# Patient Record
Sex: Male | Born: 1935
Health system: Southern US, Community
[De-identification: ages and names within clinical notes are randomized; demographics above are authoritative.]

## PROBLEM LIST (undated history)

## (undated) DIAGNOSIS — I739 Peripheral vascular disease, unspecified: Secondary | ICD-10-CM

## (undated) DIAGNOSIS — I779 Disorder of arteries and arterioles, unspecified: Secondary | ICD-10-CM

## (undated) DIAGNOSIS — M109 Gout, unspecified: Secondary | ICD-10-CM

## (undated) DIAGNOSIS — N400 Enlarged prostate without lower urinary tract symptoms: Secondary | ICD-10-CM

## (undated) DIAGNOSIS — I2510 Atherosclerotic heart disease of native coronary artery without angina pectoris: Secondary | ICD-10-CM

## (undated) DIAGNOSIS — I10 Essential (primary) hypertension: Secondary | ICD-10-CM

## (undated) HISTORY — PX: CORONARY ARTERY BYPASS GRAFT: SHX141

## (undated) HISTORY — PX: APPENDECTOMY: SHX54

## (undated) HISTORY — PX: TONSILLECTOMY: SUR1361

## (undated) HISTORY — PX: HERNIA REPAIR: SHX51

---

## 2009-02-14 DIAGNOSIS — I219 Acute myocardial infarction, unspecified: Secondary | ICD-10-CM

## 2009-02-14 HISTORY — DX: Acute myocardial infarction, unspecified: I21.9

## 2015-03-11 DIAGNOSIS — I2510 Atherosclerotic heart disease of native coronary artery without angina pectoris: Secondary | ICD-10-CM | POA: Diagnosis not present

## 2015-03-11 DIAGNOSIS — I872 Venous insufficiency (chronic) (peripheral): Secondary | ICD-10-CM | POA: Diagnosis not present

## 2015-03-11 DIAGNOSIS — I252 Old myocardial infarction: Secondary | ICD-10-CM | POA: Diagnosis not present

## 2015-03-11 DIAGNOSIS — Z6830 Body mass index (BMI) 30.0-30.9, adult: Secondary | ICD-10-CM | POA: Diagnosis not present

## 2015-07-09 DIAGNOSIS — I872 Venous insufficiency (chronic) (peripheral): Secondary | ICD-10-CM | POA: Diagnosis not present

## 2015-07-09 DIAGNOSIS — E785 Hyperlipidemia, unspecified: Secondary | ICD-10-CM | POA: Diagnosis not present

## 2015-07-09 DIAGNOSIS — I2510 Atherosclerotic heart disease of native coronary artery without angina pectoris: Secondary | ICD-10-CM | POA: Diagnosis not present

## 2015-07-09 DIAGNOSIS — I252 Old myocardial infarction: Secondary | ICD-10-CM | POA: Diagnosis not present

## 2015-11-13 DIAGNOSIS — I209 Angina pectoris, unspecified: Secondary | ICD-10-CM | POA: Diagnosis not present

## 2015-11-13 DIAGNOSIS — I25118 Atherosclerotic heart disease of native coronary artery with other forms of angina pectoris: Secondary | ICD-10-CM | POA: Diagnosis not present

## 2015-11-13 DIAGNOSIS — R0602 Shortness of breath: Secondary | ICD-10-CM | POA: Diagnosis not present

## 2015-11-13 DIAGNOSIS — I872 Venous insufficiency (chronic) (peripheral): Secondary | ICD-10-CM | POA: Diagnosis not present

## 2015-12-04 DIAGNOSIS — I209 Angina pectoris, unspecified: Secondary | ICD-10-CM | POA: Diagnosis not present

## 2015-12-04 DIAGNOSIS — R0602 Shortness of breath: Secondary | ICD-10-CM | POA: Diagnosis not present

## 2015-12-14 DIAGNOSIS — I2510 Atherosclerotic heart disease of native coronary artery without angina pectoris: Secondary | ICD-10-CM | POA: Diagnosis not present

## 2015-12-21 DIAGNOSIS — I739 Peripheral vascular disease, unspecified: Secondary | ICD-10-CM | POA: Diagnosis not present

## 2015-12-26 DIAGNOSIS — I10 Essential (primary) hypertension: Secondary | ICD-10-CM | POA: Diagnosis not present

## 2015-12-26 DIAGNOSIS — J40 Bronchitis, not specified as acute or chronic: Secondary | ICD-10-CM | POA: Diagnosis not present

## 2015-12-26 DIAGNOSIS — F17210 Nicotine dependence, cigarettes, uncomplicated: Secondary | ICD-10-CM | POA: Diagnosis not present

## 2015-12-26 DIAGNOSIS — I2510 Atherosclerotic heart disease of native coronary artery without angina pectoris: Secondary | ICD-10-CM | POA: Diagnosis not present

## 2015-12-26 DIAGNOSIS — J410 Simple chronic bronchitis: Secondary | ICD-10-CM | POA: Diagnosis not present

## 2015-12-26 DIAGNOSIS — I4891 Unspecified atrial fibrillation: Secondary | ICD-10-CM | POA: Diagnosis not present

## 2015-12-26 DIAGNOSIS — R0602 Shortness of breath: Secondary | ICD-10-CM | POA: Diagnosis not present

## 2015-12-26 DIAGNOSIS — R9431 Abnormal electrocardiogram [ECG] [EKG]: Secondary | ICD-10-CM | POA: Diagnosis not present

## 2016-01-30 DIAGNOSIS — I440 Atrioventricular block, first degree: Secondary | ICD-10-CM | POA: Diagnosis not present

## 2016-01-30 DIAGNOSIS — Z7982 Long term (current) use of aspirin: Secondary | ICD-10-CM | POA: Diagnosis not present

## 2016-01-30 DIAGNOSIS — R0789 Other chest pain: Secondary | ICD-10-CM | POA: Diagnosis not present

## 2016-01-30 DIAGNOSIS — E7800 Pure hypercholesterolemia, unspecified: Secondary | ICD-10-CM | POA: Diagnosis not present

## 2016-01-30 DIAGNOSIS — Z951 Presence of aortocoronary bypass graft: Secondary | ICD-10-CM | POA: Diagnosis not present

## 2016-01-30 DIAGNOSIS — Z955 Presence of coronary angioplasty implant and graft: Secondary | ICD-10-CM | POA: Diagnosis not present

## 2016-01-30 DIAGNOSIS — I2510 Atherosclerotic heart disease of native coronary artery without angina pectoris: Secondary | ICD-10-CM | POA: Diagnosis not present

## 2016-01-30 DIAGNOSIS — M79601 Pain in right arm: Secondary | ICD-10-CM | POA: Diagnosis not present

## 2016-01-30 DIAGNOSIS — R079 Chest pain, unspecified: Secondary | ICD-10-CM | POA: Diagnosis not present

## 2016-01-30 DIAGNOSIS — M19039 Primary osteoarthritis, unspecified wrist: Secondary | ICD-10-CM | POA: Diagnosis not present

## 2016-01-30 DIAGNOSIS — M25531 Pain in right wrist: Secondary | ICD-10-CM | POA: Diagnosis not present

## 2016-01-30 DIAGNOSIS — I739 Peripheral vascular disease, unspecified: Secondary | ICD-10-CM | POA: Diagnosis not present

## 2016-01-30 DIAGNOSIS — I10 Essential (primary) hypertension: Secondary | ICD-10-CM | POA: Diagnosis not present

## 2016-01-30 DIAGNOSIS — F17210 Nicotine dependence, cigarettes, uncomplicated: Secondary | ICD-10-CM | POA: Diagnosis not present

## 2016-01-30 DIAGNOSIS — I4510 Unspecified right bundle-branch block: Secondary | ICD-10-CM | POA: Diagnosis not present

## 2016-01-30 DIAGNOSIS — R200 Anesthesia of skin: Secondary | ICD-10-CM | POA: Diagnosis not present

## 2016-01-30 DIAGNOSIS — Z79899 Other long term (current) drug therapy: Secondary | ICD-10-CM | POA: Diagnosis not present

## 2016-01-30 DIAGNOSIS — R202 Paresthesia of skin: Secondary | ICD-10-CM | POA: Diagnosis not present

## 2016-01-30 DIAGNOSIS — M79631 Pain in right forearm: Secondary | ICD-10-CM | POA: Diagnosis not present

## 2016-02-03 DIAGNOSIS — I10 Essential (primary) hypertension: Secondary | ICD-10-CM | POA: Diagnosis not present

## 2016-02-03 DIAGNOSIS — I739 Peripheral vascular disease, unspecified: Secondary | ICD-10-CM | POA: Diagnosis not present

## 2016-02-03 DIAGNOSIS — I2510 Atherosclerotic heart disease of native coronary artery without angina pectoris: Secondary | ICD-10-CM | POA: Diagnosis not present

## 2016-02-03 DIAGNOSIS — R0989 Other specified symptoms and signs involving the circulatory and respiratory systems: Secondary | ICD-10-CM | POA: Diagnosis not present

## 2016-02-18 DIAGNOSIS — H9190 Unspecified hearing loss, unspecified ear: Secondary | ICD-10-CM | POA: Diagnosis not present

## 2016-02-18 DIAGNOSIS — Z951 Presence of aortocoronary bypass graft: Secondary | ICD-10-CM | POA: Diagnosis not present

## 2016-02-18 DIAGNOSIS — Z8673 Personal history of transient ischemic attack (TIA), and cerebral infarction without residual deficits: Secondary | ICD-10-CM | POA: Diagnosis not present

## 2016-02-18 DIAGNOSIS — E785 Hyperlipidemia, unspecified: Secondary | ICD-10-CM | POA: Diagnosis not present

## 2016-02-18 DIAGNOSIS — J09X2 Influenza due to identified novel influenza A virus with other respiratory manifestations: Secondary | ICD-10-CM | POA: Diagnosis not present

## 2016-02-18 DIAGNOSIS — J101 Influenza due to other identified influenza virus with other respiratory manifestations: Secondary | ICD-10-CM | POA: Diagnosis not present

## 2016-02-18 DIAGNOSIS — E7800 Pure hypercholesterolemia, unspecified: Secondary | ICD-10-CM | POA: Diagnosis not present

## 2016-02-18 DIAGNOSIS — I2510 Atherosclerotic heart disease of native coronary artery without angina pectoris: Secondary | ICD-10-CM | POA: Diagnosis not present

## 2016-02-18 DIAGNOSIS — M5431 Sciatica, right side: Secondary | ICD-10-CM | POA: Diagnosis not present

## 2016-02-18 DIAGNOSIS — I10 Essential (primary) hypertension: Secondary | ICD-10-CM | POA: Diagnosis not present

## 2016-04-01 DIAGNOSIS — Z720 Tobacco use: Secondary | ICD-10-CM | POA: Diagnosis not present

## 2016-04-01 DIAGNOSIS — J111 Influenza due to unidentified influenza virus with other respiratory manifestations: Secondary | ICD-10-CM | POA: Diagnosis not present

## 2016-04-01 DIAGNOSIS — R070 Pain in throat: Secondary | ICD-10-CM | POA: Diagnosis not present

## 2016-04-01 DIAGNOSIS — Z8719 Personal history of other diseases of the digestive system: Secondary | ICD-10-CM | POA: Diagnosis not present

## 2016-04-01 DIAGNOSIS — R05 Cough: Secondary | ICD-10-CM | POA: Diagnosis not present

## 2016-04-01 DIAGNOSIS — E7800 Pure hypercholesterolemia, unspecified: Secondary | ICD-10-CM | POA: Diagnosis not present

## 2016-04-01 DIAGNOSIS — Z7901 Long term (current) use of anticoagulants: Secondary | ICD-10-CM | POA: Diagnosis not present

## 2016-04-01 DIAGNOSIS — I10 Essential (primary) hypertension: Secondary | ICD-10-CM | POA: Diagnosis not present

## 2016-04-01 DIAGNOSIS — Z8673 Personal history of transient ischemic attack (TIA), and cerebral infarction without residual deficits: Secondary | ICD-10-CM | POA: Diagnosis not present

## 2016-04-01 DIAGNOSIS — Z951 Presence of aortocoronary bypass graft: Secondary | ICD-10-CM | POA: Diagnosis not present

## 2016-04-01 DIAGNOSIS — I2510 Atherosclerotic heart disease of native coronary artery without angina pectoris: Secondary | ICD-10-CM | POA: Diagnosis not present

## 2016-04-01 DIAGNOSIS — M1990 Unspecified osteoarthritis, unspecified site: Secondary | ICD-10-CM | POA: Diagnosis not present

## 2016-04-01 DIAGNOSIS — J9801 Acute bronchospasm: Secondary | ICD-10-CM | POA: Diagnosis not present

## 2016-04-01 DIAGNOSIS — Z8739 Personal history of other diseases of the musculoskeletal system and connective tissue: Secondary | ICD-10-CM | POA: Diagnosis not present

## 2016-06-19 DIAGNOSIS — M19019 Primary osteoarthritis, unspecified shoulder: Secondary | ICD-10-CM | POA: Diagnosis not present

## 2016-06-19 DIAGNOSIS — M25512 Pain in left shoulder: Secondary | ICD-10-CM | POA: Diagnosis not present

## 2016-06-19 DIAGNOSIS — M19012 Primary osteoarthritis, left shoulder: Secondary | ICD-10-CM | POA: Diagnosis not present

## 2016-06-19 DIAGNOSIS — G8929 Other chronic pain: Secondary | ICD-10-CM | POA: Diagnosis not present

## 2016-07-08 DIAGNOSIS — Z716 Tobacco abuse counseling: Secondary | ICD-10-CM | POA: Diagnosis not present

## 2016-07-08 DIAGNOSIS — I10 Essential (primary) hypertension: Secondary | ICD-10-CM | POA: Diagnosis not present

## 2016-07-08 DIAGNOSIS — Z720 Tobacco use: Secondary | ICD-10-CM | POA: Diagnosis not present

## 2016-07-08 DIAGNOSIS — E782 Mixed hyperlipidemia: Secondary | ICD-10-CM | POA: Diagnosis not present

## 2016-07-08 DIAGNOSIS — I2510 Atherosclerotic heart disease of native coronary artery without angina pectoris: Secondary | ICD-10-CM | POA: Diagnosis not present

## 2016-08-23 DIAGNOSIS — I10 Essential (primary) hypertension: Secondary | ICD-10-CM | POA: Diagnosis not present

## 2016-08-23 DIAGNOSIS — M19019 Primary osteoarthritis, unspecified shoulder: Secondary | ICD-10-CM | POA: Diagnosis not present

## 2016-08-23 DIAGNOSIS — N183 Chronic kidney disease, stage 3 (moderate): Secondary | ICD-10-CM | POA: Diagnosis not present

## 2016-08-23 DIAGNOSIS — E785 Hyperlipidemia, unspecified: Secondary | ICD-10-CM | POA: Diagnosis not present

## 2016-08-23 DIAGNOSIS — Z6831 Body mass index (BMI) 31.0-31.9, adult: Secondary | ICD-10-CM | POA: Diagnosis not present

## 2016-08-25 DIAGNOSIS — E785 Hyperlipidemia, unspecified: Secondary | ICD-10-CM | POA: Diagnosis not present

## 2016-08-25 DIAGNOSIS — I2510 Atherosclerotic heart disease of native coronary artery without angina pectoris: Secondary | ICD-10-CM | POA: Diagnosis not present

## 2016-08-25 DIAGNOSIS — I129 Hypertensive chronic kidney disease with stage 1 through stage 4 chronic kidney disease, or unspecified chronic kidney disease: Secondary | ICD-10-CM | POA: Diagnosis not present

## 2016-08-25 DIAGNOSIS — N201 Calculus of ureter: Secondary | ICD-10-CM | POA: Diagnosis not present

## 2016-08-25 DIAGNOSIS — N183 Chronic kidney disease, stage 3 (moderate): Secondary | ICD-10-CM | POA: Diagnosis not present

## 2016-08-25 DIAGNOSIS — M19019 Primary osteoarthritis, unspecified shoulder: Secondary | ICD-10-CM | POA: Diagnosis not present

## 2016-08-25 DIAGNOSIS — M19012 Primary osteoarthritis, left shoulder: Secondary | ICD-10-CM | POA: Diagnosis not present

## 2016-08-29 DIAGNOSIS — M19072 Primary osteoarthritis, left ankle and foot: Secondary | ICD-10-CM | POA: Diagnosis not present

## 2016-08-29 DIAGNOSIS — G8929 Other chronic pain: Secondary | ICD-10-CM | POA: Diagnosis not present

## 2016-08-29 DIAGNOSIS — M79672 Pain in left foot: Secondary | ICD-10-CM | POA: Diagnosis not present

## 2016-08-29 DIAGNOSIS — M25512 Pain in left shoulder: Secondary | ICD-10-CM | POA: Diagnosis not present

## 2016-08-29 DIAGNOSIS — I10 Essential (primary) hypertension: Secondary | ICD-10-CM | POA: Diagnosis not present

## 2016-08-29 DIAGNOSIS — F17210 Nicotine dependence, cigarettes, uncomplicated: Secondary | ICD-10-CM | POA: Diagnosis not present

## 2016-09-08 DIAGNOSIS — S43012A Anterior subluxation of left humerus, initial encounter: Secondary | ICD-10-CM | POA: Diagnosis not present

## 2016-09-08 DIAGNOSIS — Z6830 Body mass index (BMI) 30.0-30.9, adult: Secondary | ICD-10-CM | POA: Diagnosis not present

## 2016-09-08 DIAGNOSIS — Z13820 Encounter for screening for osteoporosis: Secondary | ICD-10-CM | POA: Diagnosis not present

## 2016-09-08 DIAGNOSIS — F17290 Nicotine dependence, other tobacco product, uncomplicated: Secondary | ICD-10-CM | POA: Diagnosis not present

## 2016-09-19 DIAGNOSIS — S46012A Strain of muscle(s) and tendon(s) of the rotator cuff of left shoulder, initial encounter: Secondary | ICD-10-CM | POA: Diagnosis not present

## 2016-09-21 DIAGNOSIS — E559 Vitamin D deficiency, unspecified: Secondary | ICD-10-CM | POA: Diagnosis not present

## 2016-09-21 DIAGNOSIS — M810 Age-related osteoporosis without current pathological fracture: Secondary | ICD-10-CM | POA: Diagnosis not present

## 2016-09-21 DIAGNOSIS — S46012A Strain of muscle(s) and tendon(s) of the rotator cuff of left shoulder, initial encounter: Secondary | ICD-10-CM | POA: Diagnosis not present

## 2016-09-21 DIAGNOSIS — M859 Disorder of bone density and structure, unspecified: Secondary | ICD-10-CM | POA: Diagnosis not present

## 2016-10-25 DIAGNOSIS — M25572 Pain in left ankle and joints of left foot: Secondary | ICD-10-CM | POA: Diagnosis not present

## 2016-10-25 DIAGNOSIS — M1990 Unspecified osteoarthritis, unspecified site: Secondary | ICD-10-CM | POA: Diagnosis not present

## 2016-10-25 DIAGNOSIS — M109 Gout, unspecified: Secondary | ICD-10-CM | POA: Diagnosis not present

## 2016-10-25 DIAGNOSIS — F17200 Nicotine dependence, unspecified, uncomplicated: Secondary | ICD-10-CM | POA: Diagnosis not present

## 2016-10-28 ENCOUNTER — Encounter: Payer: Self-pay | Admitting: Podiatry

## 2016-10-28 ENCOUNTER — Ambulatory Visit (INDEPENDENT_AMBULATORY_CARE_PROVIDER_SITE_OTHER): Payer: Medicare Other | Admitting: Podiatry

## 2016-10-28 ENCOUNTER — Ambulatory Visit (INDEPENDENT_AMBULATORY_CARE_PROVIDER_SITE_OTHER): Payer: Medicare Other

## 2016-10-28 ENCOUNTER — Other Ambulatory Visit: Payer: Self-pay | Admitting: Podiatry

## 2016-10-28 VITALS — BP 174/92 | HR 78 | Resp 16

## 2016-10-28 DIAGNOSIS — M25572 Pain in left ankle and joints of left foot: Secondary | ICD-10-CM

## 2016-10-28 DIAGNOSIS — M779 Enthesopathy, unspecified: Secondary | ICD-10-CM | POA: Diagnosis not present

## 2016-10-28 DIAGNOSIS — M1000 Idiopathic gout, unspecified site: Secondary | ICD-10-CM

## 2016-10-28 MED ORDER — METHYLPREDNISOLONE 4 MG PO TBPK: ORAL_TABLET | ORAL | 0 refills | Status: DC

## 2016-10-28 MED ORDER — TRIAMCINOLONE ACETONIDE 10 MG/ML IJ SUSP
10.0000 mg | Freq: Once | INTRAMUSCULAR | Status: AC
  Administered 2016-10-28: 10 mg

## 2016-10-28 NOTE — Patient Instructions (Signed)
Calcium Pyrophosphate Deposition  Calcium pyrophosphate deposition (CPPD), which is also called pseudogout, is a type of arthritis that causes pain, swelling, and inflammation in a joint. The joint pain can be severe and may last for days. If it is not treated, the pain may last much longer. Attacks of CPPD may come and go. This condition usually affects one joint at a time. The joints that are affected most commonly are the knees, but this condition can also affect the wrists, elbows, shoulders, or ankles.  CPPD is similar to gout. Both conditions result from the buildup of crystals in the joint. However, CPPD is caused by a type of crystal that is different than the crystals that cause gout.  What are the causes?  This condition is caused by the buildup of calcium pyrophosphate dihydrate crystals in the joint. The reason why this buildup occurs is not known. The condition may be passed down from parent to child (hereditary).  What increases the risk?  This condition is more likely to develop in people who:  · Are over 60 years old.  · Have a family history of the condition.  · Have had joint replacement surgery.  · Have had a recent injury.  · Have certain medical conditions, such as hemophilia, ochronosis, amyloidosis, or hormonal disorders.  · Have low blood magnesium levels.    What are the signs or symptoms?  Symptoms of this condition include:  · Pain in a joint. The pain may:  ? Be intense and constant.  ? Come on quickly.  ? Get worse with movement.  ? Last from several days to a few weeks.  · Redness, swelling, and warmth at the joint.  · Stiffness of the joint.    How is this diagnosed?  To diagnose this condition, your health care provider will use a needle to remove fluid from the joint. The fluid will be examined under a microscope to check for the crystals that cause CPPD. You may also have imaging tests, such as:  · X-rays.  · Ultrasound.    How is this treated?  There is no way to remove the  crystals from the joint and no way to cure this condition. However, treatment can relieve symptoms and improve joint function. Treatment may include:  · Nonsteroidal anti-inflammatory drugs (NSAIDs) to reduce inflammation and pain.  · Medicines to help prevent attacks.  · Injections of medicine (cortisone) into the joint to reduce pain and swelling.  · Physical therapy to improve joint function.    Follow these instructions at home:  · Take medicines only as directed by your health care provider.  · Rest the affected joints until your symptoms start to go away.  · Keep your affected joints raised (elevated) when possible. This will help to reduce swelling.  · If directed, apply ice to the affected area:  ? Put ice in a plastic bag.  ? Place a towel between your skin and the bag.  ? Leave the ice on for 20 minutes, 2-3 times per day.  · If the painful joint is in your leg, use crutches as directed by your health care provider.  · When your symptoms start to go away, begin to exercise regularly or do physical therapy. Talk with your health care provider or physical therapist about what types of exercise are safe for you. Low-impact exercise may be best. This includes walking, swimming, bicycling, and water aerobics.  · Maintain a healthy weight so your joints do not   need to bear more weight than necessary.  Contact a health care provider if:  · You have an increase in joint pain that is not relieved with medicine.  · Your joint becomes more red, swollen, or stiff.  · You have a fever.  · You have a skin rash.  This information is not intended to replace advice given to you by your health care provider. Make sure you discuss any questions you have with your health care provider.  Document Released: 10/24/2003 Document Revised: 07/09/2015 Document Reviewed: 01/08/2014  Elsevier Interactive Patient Education © 2018 [employer]

## 2016-10-28 NOTE — Progress Notes (Signed)
   Subjective:    Patient ID: Terry Fitzgerald, male    DOB: February 04, 1936, 81 y.o.   MRN: [id number]  HPI Chief Complaint  Patient presents with  . Ankle Pain    Left foot; medial side; redness and warm to the touch; pt stated, "Has arthritis in ankle; went to ER in [place] on Monday, October 25, 2016 and was given Motrin; was told had Gout and acute arthritis"; x2 days      Review of Systems  Musculoskeletal: Positive for gait problem.  All other systems reviewed and are negative.      Objective:   Physical Exam        Assessment & Plan:

## 2016-10-29 NOTE — Progress Notes (Signed)
Subjective:    Patient ID: Terry Fitzgerald, male   DOB: 81 y.o.   MRN: [id number]   HPI patient states that he has a lot of pain in the medial side of his left ankle and he's had a history of gout but is not had an attack for a fairly long period of time    Review of Systems  All other systems reviewed and are negative.       Objective:  Physical Exam  Constitutional: He appears well-developed and well-nourished.  Cardiovascular: Intact distal pulses.   Pulmonary/Chest: Effort normal.  Musculoskeletal: Normal range of motion.  Neurological: He is alert. Abnormal coordination: neurovascular status intact muscle strength adequate range of motion within normal limits with patient noted to have.  Skin: Skin is warm.  Nursing note and vitals reviewed.  quite a bit of discomfort in the medial ankle left with inflammation redness and no indication of posterior tibial dysfunction. Found to have good digital perfusion and well oriented 3     Assessment:  Inflammatory tendinitis of the medial side of the left ankle with probability of gout and no indication of infection       Plan:    Careful injection administered left 3 Milligan Kenalog 5 mill grams Xylocaine after H&P and review of x-rays. I then placed patient on Medrol Dosepak 6 day and advised on soaks therapy  X-rays indicate that there is no indications of arthritic process or collapse medial longitudinal arch

## 2016-11-17 DIAGNOSIS — J441 Chronic obstructive pulmonary disease with (acute) exacerbation: Secondary | ICD-10-CM | POA: Diagnosis not present

## 2016-11-17 DIAGNOSIS — I2510 Atherosclerotic heart disease of native coronary artery without angina pectoris: Secondary | ICD-10-CM | POA: Diagnosis not present

## 2016-11-17 DIAGNOSIS — I10 Essential (primary) hypertension: Secondary | ICD-10-CM | POA: Diagnosis not present

## 2016-11-17 DIAGNOSIS — E7849 Other hyperlipidemia: Secondary | ICD-10-CM | POA: Diagnosis not present

## 2016-11-23 DIAGNOSIS — I2510 Atherosclerotic heart disease of native coronary artery without angina pectoris: Secondary | ICD-10-CM | POA: Diagnosis not present

## 2016-11-23 DIAGNOSIS — J441 Chronic obstructive pulmonary disease with (acute) exacerbation: Secondary | ICD-10-CM | POA: Diagnosis not present

## 2016-11-23 DIAGNOSIS — I10 Essential (primary) hypertension: Secondary | ICD-10-CM | POA: Diagnosis not present

## 2016-11-23 DIAGNOSIS — E7849 Other hyperlipidemia: Secondary | ICD-10-CM | POA: Diagnosis not present

## 2016-11-23 DIAGNOSIS — Z131 Encounter for screening for diabetes mellitus: Secondary | ICD-10-CM | POA: Diagnosis not present

## 2016-11-28 DIAGNOSIS — M19012 Primary osteoarthritis, left shoulder: Secondary | ICD-10-CM | POA: Diagnosis not present

## 2016-11-28 DIAGNOSIS — M75122 Complete rotator cuff tear or rupture of left shoulder, not specified as traumatic: Secondary | ICD-10-CM | POA: Diagnosis not present

## 2017-03-13 DIAGNOSIS — M75122 Complete rotator cuff tear or rupture of left shoulder, not specified as traumatic: Secondary | ICD-10-CM | POA: Diagnosis not present

## 2017-03-13 DIAGNOSIS — M19012 Primary osteoarthritis, left shoulder: Secondary | ICD-10-CM | POA: Diagnosis not present

## 2017-03-27 DIAGNOSIS — Z0000 Encounter for general adult medical examination without abnormal findings: Secondary | ICD-10-CM | POA: Diagnosis not present

## 2017-03-27 DIAGNOSIS — I2510 Atherosclerotic heart disease of native coronary artery without angina pectoris: Secondary | ICD-10-CM | POA: Diagnosis not present

## 2017-03-27 DIAGNOSIS — E7849 Other hyperlipidemia: Secondary | ICD-10-CM | POA: Diagnosis not present

## 2017-03-27 DIAGNOSIS — J441 Chronic obstructive pulmonary disease with (acute) exacerbation: Secondary | ICD-10-CM | POA: Diagnosis not present

## 2017-03-27 DIAGNOSIS — I10 Essential (primary) hypertension: Secondary | ICD-10-CM | POA: Diagnosis not present

## 2017-04-10 DIAGNOSIS — K4090 Unilateral inguinal hernia, without obstruction or gangrene, not specified as recurrent: Secondary | ICD-10-CM | POA: Diagnosis not present

## 2017-04-19 DIAGNOSIS — K4090 Unilateral inguinal hernia, without obstruction or gangrene, not specified as recurrent: Secondary | ICD-10-CM | POA: Diagnosis not present

## 2017-05-08 DIAGNOSIS — I119 Hypertensive heart disease without heart failure: Secondary | ICD-10-CM | POA: Diagnosis not present

## 2017-05-08 DIAGNOSIS — Z87891 Personal history of nicotine dependence: Secondary | ICD-10-CM | POA: Diagnosis not present

## 2017-05-08 DIAGNOSIS — Z79899 Other long term (current) drug therapy: Secondary | ICD-10-CM | POA: Diagnosis not present

## 2017-05-08 DIAGNOSIS — Z886 Allergy status to analgesic agent status: Secondary | ICD-10-CM | POA: Diagnosis not present

## 2017-05-08 DIAGNOSIS — M109 Gout, unspecified: Secondary | ICD-10-CM | POA: Diagnosis not present

## 2017-05-08 DIAGNOSIS — Z7902 Long term (current) use of antithrombotics/antiplatelets: Secondary | ICD-10-CM | POA: Diagnosis not present

## 2017-05-08 DIAGNOSIS — K4030 Unilateral inguinal hernia, with obstruction, without gangrene, not specified as recurrent: Secondary | ICD-10-CM | POA: Diagnosis not present

## 2017-05-08 DIAGNOSIS — Z951 Presence of aortocoronary bypass graft: Secondary | ICD-10-CM | POA: Diagnosis not present

## 2017-05-08 DIAGNOSIS — M1990 Unspecified osteoarthritis, unspecified site: Secondary | ICD-10-CM | POA: Diagnosis not present

## 2017-05-09 DIAGNOSIS — Z79899 Other long term (current) drug therapy: Secondary | ICD-10-CM | POA: Diagnosis not present

## 2017-05-09 DIAGNOSIS — Z951 Presence of aortocoronary bypass graft: Secondary | ICD-10-CM | POA: Diagnosis not present

## 2017-05-09 DIAGNOSIS — Z7902 Long term (current) use of antithrombotics/antiplatelets: Secondary | ICD-10-CM | POA: Diagnosis not present

## 2017-05-09 DIAGNOSIS — M109 Gout, unspecified: Secondary | ICD-10-CM | POA: Diagnosis not present

## 2017-05-09 DIAGNOSIS — K4030 Unilateral inguinal hernia, with obstruction, without gangrene, not specified as recurrent: Secondary | ICD-10-CM | POA: Diagnosis not present

## 2017-05-09 DIAGNOSIS — K4090 Unilateral inguinal hernia, without obstruction or gangrene, not specified as recurrent: Secondary | ICD-10-CM | POA: Diagnosis not present

## 2017-05-09 DIAGNOSIS — M1990 Unspecified osteoarthritis, unspecified site: Secondary | ICD-10-CM | POA: Diagnosis not present

## 2017-07-18 DIAGNOSIS — Z79899 Other long term (current) drug therapy: Secondary | ICD-10-CM | POA: Diagnosis not present

## 2017-07-18 DIAGNOSIS — K4090 Unilateral inguinal hernia, without obstruction or gangrene, not specified as recurrent: Secondary | ICD-10-CM | POA: Diagnosis not present

## 2017-07-18 DIAGNOSIS — Z125 Encounter for screening for malignant neoplasm of prostate: Secondary | ICD-10-CM | POA: Diagnosis not present

## 2017-10-19 DIAGNOSIS — Z951 Presence of aortocoronary bypass graft: Secondary | ICD-10-CM | POA: Diagnosis not present

## 2017-10-19 DIAGNOSIS — Z885 Allergy status to narcotic agent status: Secondary | ICD-10-CM | POA: Diagnosis not present

## 2017-10-19 DIAGNOSIS — Z87891 Personal history of nicotine dependence: Secondary | ICD-10-CM | POA: Diagnosis not present

## 2017-10-19 DIAGNOSIS — M25571 Pain in right ankle and joints of right foot: Secondary | ICD-10-CM | POA: Diagnosis not present

## 2017-10-19 DIAGNOSIS — I10 Essential (primary) hypertension: Secondary | ICD-10-CM | POA: Diagnosis not present

## 2017-10-19 DIAGNOSIS — Z79899 Other long term (current) drug therapy: Secondary | ICD-10-CM | POA: Diagnosis not present

## 2017-10-19 DIAGNOSIS — M109 Gout, unspecified: Secondary | ICD-10-CM | POA: Diagnosis not present

## 2017-10-19 DIAGNOSIS — M10071 Idiopathic gout, right ankle and foot: Secondary | ICD-10-CM | POA: Diagnosis not present

## 2017-10-19 DIAGNOSIS — Z7982 Long term (current) use of aspirin: Secondary | ICD-10-CM | POA: Diagnosis not present

## 2017-10-26 DIAGNOSIS — I10 Essential (primary) hypertension: Secondary | ICD-10-CM | POA: Diagnosis not present

## 2017-10-26 DIAGNOSIS — I25718 Atherosclerosis of autologous vein coronary artery bypass graft(s) with other forms of angina pectoris: Secondary | ICD-10-CM | POA: Diagnosis not present

## 2017-10-26 DIAGNOSIS — E782 Mixed hyperlipidemia: Secondary | ICD-10-CM | POA: Diagnosis not present

## 2017-10-26 DIAGNOSIS — N400 Enlarged prostate without lower urinary tract symptoms: Secondary | ICD-10-CM | POA: Diagnosis not present

## 2017-11-19 DIAGNOSIS — Z79899 Other long term (current) drug therapy: Secondary | ICD-10-CM | POA: Diagnosis not present

## 2017-11-19 DIAGNOSIS — Z7902 Long term (current) use of antithrombotics/antiplatelets: Secondary | ICD-10-CM | POA: Diagnosis not present

## 2017-11-19 DIAGNOSIS — J209 Acute bronchitis, unspecified: Secondary | ICD-10-CM | POA: Diagnosis not present

## 2017-11-19 DIAGNOSIS — I10 Essential (primary) hypertension: Secondary | ICD-10-CM | POA: Diagnosis not present

## 2017-11-19 DIAGNOSIS — R05 Cough: Secondary | ICD-10-CM | POA: Diagnosis not present

## 2017-11-19 DIAGNOSIS — Z7982 Long term (current) use of aspirin: Secondary | ICD-10-CM | POA: Diagnosis not present

## 2017-11-19 DIAGNOSIS — J40 Bronchitis, not specified as acute or chronic: Secondary | ICD-10-CM | POA: Diagnosis not present

## 2017-11-19 DIAGNOSIS — F17200 Nicotine dependence, unspecified, uncomplicated: Secondary | ICD-10-CM | POA: Diagnosis not present

## 2017-11-19 DIAGNOSIS — Z720 Tobacco use: Secondary | ICD-10-CM | POA: Diagnosis not present

## 2018-02-08 DIAGNOSIS — I25718 Atherosclerosis of autologous vein coronary artery bypass graft(s) with other forms of angina pectoris: Secondary | ICD-10-CM | POA: Diagnosis not present

## 2018-02-08 DIAGNOSIS — N400 Enlarged prostate without lower urinary tract symptoms: Secondary | ICD-10-CM | POA: Diagnosis not present

## 2018-02-08 DIAGNOSIS — E782 Mixed hyperlipidemia: Secondary | ICD-10-CM | POA: Diagnosis not present

## 2018-02-08 DIAGNOSIS — I10 Essential (primary) hypertension: Secondary | ICD-10-CM | POA: Diagnosis not present

## 2018-03-24 DIAGNOSIS — R69 Illness, unspecified: Secondary | ICD-10-CM | POA: Diagnosis not present

## 2018-03-24 DIAGNOSIS — M109 Gout, unspecified: Secondary | ICD-10-CM | POA: Diagnosis not present

## 2018-03-24 DIAGNOSIS — M549 Dorsalgia, unspecified: Secondary | ICD-10-CM | POA: Diagnosis not present

## 2018-03-24 DIAGNOSIS — Z79899 Other long term (current) drug therapy: Secondary | ICD-10-CM | POA: Diagnosis not present

## 2018-03-24 DIAGNOSIS — I10 Essential (primary) hypertension: Secondary | ICD-10-CM | POA: Diagnosis not present

## 2018-03-24 DIAGNOSIS — Z720 Tobacco use: Secondary | ICD-10-CM | POA: Diagnosis not present

## 2018-03-24 DIAGNOSIS — M545 Low back pain: Secondary | ICD-10-CM | POA: Diagnosis not present

## 2018-03-28 DIAGNOSIS — M545 Low back pain: Secondary | ICD-10-CM | POA: Diagnosis not present

## 2018-03-28 DIAGNOSIS — I10 Essential (primary) hypertension: Secondary | ICD-10-CM | POA: Diagnosis not present

## 2018-05-10 DIAGNOSIS — Z6829 Body mass index (BMI) 29.0-29.9, adult: Secondary | ICD-10-CM | POA: Diagnosis not present

## 2018-05-10 DIAGNOSIS — I10 Essential (primary) hypertension: Secondary | ICD-10-CM | POA: Diagnosis not present

## 2018-05-10 DIAGNOSIS — M545 Low back pain: Secondary | ICD-10-CM | POA: Diagnosis not present

## 2018-05-10 DIAGNOSIS — E785 Hyperlipidemia, unspecified: Secondary | ICD-10-CM | POA: Diagnosis not present

## 2018-05-10 DIAGNOSIS — I7389 Other specified peripheral vascular diseases: Secondary | ICD-10-CM | POA: Diagnosis not present

## 2018-05-10 DIAGNOSIS — Z1389 Encounter for screening for other disorder: Secondary | ICD-10-CM | POA: Diagnosis not present

## 2018-05-10 DIAGNOSIS — Z0000 Encounter for general adult medical examination without abnormal findings: Secondary | ICD-10-CM | POA: Diagnosis not present

## 2018-08-08 DIAGNOSIS — Z6828 Body mass index (BMI) 28.0-28.9, adult: Secondary | ICD-10-CM | POA: Diagnosis not present

## 2018-08-08 DIAGNOSIS — M545 Low back pain: Secondary | ICD-10-CM | POA: Diagnosis not present

## 2018-08-08 DIAGNOSIS — I10 Essential (primary) hypertension: Secondary | ICD-10-CM | POA: Diagnosis not present

## 2018-08-08 DIAGNOSIS — I7389 Other specified peripheral vascular diseases: Secondary | ICD-10-CM | POA: Diagnosis not present

## 2018-08-08 DIAGNOSIS — E785 Hyperlipidemia, unspecified: Secondary | ICD-10-CM | POA: Diagnosis not present

## 2018-11-08 DIAGNOSIS — I7389 Other specified peripheral vascular diseases: Secondary | ICD-10-CM | POA: Diagnosis not present

## 2018-11-08 DIAGNOSIS — I10 Essential (primary) hypertension: Secondary | ICD-10-CM | POA: Diagnosis not present

## 2018-11-08 DIAGNOSIS — Z125 Encounter for screening for malignant neoplasm of prostate: Secondary | ICD-10-CM | POA: Diagnosis not present

## 2018-11-08 DIAGNOSIS — M545 Low back pain: Secondary | ICD-10-CM | POA: Diagnosis not present

## 2018-11-08 DIAGNOSIS — E785 Hyperlipidemia, unspecified: Secondary | ICD-10-CM | POA: Diagnosis not present

## 2018-11-08 DIAGNOSIS — E782 Mixed hyperlipidemia: Secondary | ICD-10-CM | POA: Diagnosis not present

## 2018-11-08 DIAGNOSIS — Z6828 Body mass index (BMI) 28.0-28.9, adult: Secondary | ICD-10-CM | POA: Diagnosis not present

## 2019-02-11 DIAGNOSIS — I10 Essential (primary) hypertension: Secondary | ICD-10-CM | POA: Diagnosis not present

## 2019-02-11 DIAGNOSIS — Z6828 Body mass index (BMI) 28.0-28.9, adult: Secondary | ICD-10-CM | POA: Diagnosis not present

## 2019-02-11 DIAGNOSIS — I7389 Other specified peripheral vascular diseases: Secondary | ICD-10-CM | POA: Diagnosis not present

## 2019-02-11 DIAGNOSIS — M545 Low back pain: Secondary | ICD-10-CM | POA: Diagnosis not present

## 2019-02-11 DIAGNOSIS — E782 Mixed hyperlipidemia: Secondary | ICD-10-CM | POA: Diagnosis not present

## 2019-02-20 DIAGNOSIS — H2513 Age-related nuclear cataract, bilateral: Secondary | ICD-10-CM | POA: Diagnosis not present

## 2019-03-28 DIAGNOSIS — H02831 Dermatochalasis of right upper eyelid: Secondary | ICD-10-CM | POA: Diagnosis not present

## 2019-03-28 DIAGNOSIS — H02834 Dermatochalasis of left upper eyelid: Secondary | ICD-10-CM | POA: Diagnosis not present

## 2019-03-28 DIAGNOSIS — H2513 Age-related nuclear cataract, bilateral: Secondary | ICD-10-CM | POA: Diagnosis not present

## 2019-04-10 NOTE — H&P (Addendum)
Surgical History & Physical  Patient Name: Terry Fitzgerald DOB: Jul 01, 1935  Surgery: Cataract extraction with intraocular lens implant phacoemulsification; Right Eye  Surgeon: Fabio Pierce MD Surgery Date:  05/03/2019 Pre-Op Date:  04/18/2019  HPI: A 31 Yr. old male patient -referred by Dr. Delora Fuel 1. 1. The patient complains of difficulty when driving, which began 2 years ago. Both eyes are affected. The episode is constant and gradual. The patient describes foggy, glare and hazy symptoms affecting their eyes/vision. The condition's severity decreased since last visit and is severe. Symptoms occur when the patient is driving, inside and outside. Patient d/c driving at night due to significant glare/halo with oncoming traffic. This is negatively affecting the patient's quality of life. Patient feels OD worse c/w OS. HPI Completed by Dr. Fabio Pierce  Medical History: Cataracts BPH/urinary issues Heart Problem High Blood Pressure LDL  Review of Systems Negative Allergic/Immunologic Negative Cardiovascular Negative Constitutional Negative Ear, Nose, Mouth & Throat Negative Endocrine Negative Eyes Negative Gastrointestinal Negative Genitourinary Negative Hemotologic/Lymphatic Negative Integumentary Negative Musculoskeletal Negative Neurological Negative Psychiatry Negative Respiratory  Social   Current every day smoker   Medication Aspirin, Losartan Potassium, Vitamin E, Clopidogrel, Lovastatin, Tamsulosin, Finasteride,   Sx/Procedures Triple Bypass, Appendectomy, Hernia Sx, Tonsils/adnoids removed,   Drug Allergies   NKDA  History & Physical: Heent:  Cataract, Right eye NECK: supple without bruits LUNGS: lungs clear to auscultation CV: regular rate and rhythm Abdomen: soft and non-tender  Impression & Plan: Assessment: 1.  NUCLEAR SCLEROSIS AGE RELATED; Both Eyes (H25.13) 2.  DERMATOCHALASIS, no surgery; Right Upper Lid, Left Upper Lid (H02.831,  O17.510)  Plan: 1.  Cataract accounts for the patient's decreased vision. This visual impairment is not correctable with a tolerable change in glasses or contact lenses. Cataract surgery with an implantation of a new lens should significantly improve the visual and functional status of the patient. Discussed all risks, benefits, alternatives, and potential complications. Discussed the procedures and recovery. Patient desires to have surgery. A-scan ordered and performed today for intra-ocular lens calculations. The surgery will be performed in order to improve vision for driving, reading, and for eye examinations. Recommend phacoemulsification with intra-ocular lens. Right Eye worse - first. Dilates poorly - shugacaine by protocol. Malyugin Ring - Flomax. Omidira. 2.  Asymptomatic, recommend observation for now. Findings, prognosis and treatment options reviewed.

## 2019-04-16 ENCOUNTER — Encounter (HOSPITAL_COMMUNITY): Payer: Self-pay

## 2019-04-17 ENCOUNTER — Other Ambulatory Visit: Payer: Self-pay

## 2019-04-17 ENCOUNTER — Encounter (HOSPITAL_COMMUNITY): Admission: RE | Admit: 2019-04-17 | Payer: Medicare HMO | Source: Ambulatory Visit

## 2019-04-17 ENCOUNTER — Other Ambulatory Visit (HOSPITAL_COMMUNITY)
Admission: RE | Admit: 2019-04-17 | Discharge: 2019-04-17 | Disposition: A | Discharge status: Home / Self Care | Payer: Medicare HMO | Source: Ambulatory Visit | Attending: Ophthalmology | Admitting: Ophthalmology

## 2019-04-17 HISTORY — DX: Essential (primary) hypertension: I10

## 2019-04-30 ENCOUNTER — Other Ambulatory Visit: Payer: Self-pay

## 2019-04-30 ENCOUNTER — Encounter (HOSPITAL_COMMUNITY)
Admission: RE | Admit: 2019-04-30 | Discharge: 2019-04-30 | Disposition: A | Discharge status: Home / Self Care | Payer: Medicare HMO | Source: Ambulatory Visit | Attending: Ophthalmology | Admitting: Ophthalmology

## 2019-05-01 ENCOUNTER — Other Ambulatory Visit (HOSPITAL_COMMUNITY)
Admission: RE | Admit: 2019-05-01 | Discharge: 2019-05-01 | Disposition: A | Discharge status: Home / Self Care | Payer: Medicare HMO | Source: Ambulatory Visit | Attending: Ophthalmology | Admitting: Ophthalmology

## 2019-05-01 ENCOUNTER — Other Ambulatory Visit: Payer: Self-pay

## 2019-05-13 DIAGNOSIS — Z1331 Encounter for screening for depression: Secondary | ICD-10-CM | POA: Diagnosis not present

## 2019-05-13 DIAGNOSIS — I10 Essential (primary) hypertension: Secondary | ICD-10-CM | POA: Diagnosis not present

## 2019-05-13 DIAGNOSIS — M545 Low back pain: Secondary | ICD-10-CM | POA: Diagnosis not present

## 2019-05-13 DIAGNOSIS — Z0000 Encounter for general adult medical examination without abnormal findings: Secondary | ICD-10-CM | POA: Diagnosis not present

## 2019-05-13 DIAGNOSIS — Z6828 Body mass index (BMI) 28.0-28.9, adult: Secondary | ICD-10-CM | POA: Diagnosis not present

## 2019-05-13 DIAGNOSIS — I7389 Other specified peripheral vascular diseases: Secondary | ICD-10-CM | POA: Diagnosis not present

## 2019-05-13 DIAGNOSIS — E782 Mixed hyperlipidemia: Secondary | ICD-10-CM | POA: Diagnosis not present

## 2019-05-15 NOTE — H&P (Signed)
Surgical History & Physical  Patient Name: Terry Fitzgerald DOB: 08-Sep-1935  Surgery: Cataract extraction with intraocular lens implant phacoemulsification; Right Eye  Surgeon: Fabio Pierce MD Surgery Date:  05/27/2019 Pre-Op Date:  05/02/2019  HPI: A 19 Yr. old male patient -referred by Dr. Delora Fuel 1. 1. The patient complains of difficulty when driving, which began 2 years ago. Both eyes are affected. The episode is constant and gradual. The patient describes foggy, glare and hazy symptoms affecting their eyes/vision. The condition's severity decreased since last visit and is severe. Symptoms occur when the patient is driving, inside and outside. Patient d/c driving at night due to significant glare/halo with oncoming traffic. This is negatively affecting the patient's quality of life. Patient feels OD worse c/w OS. HPI Completed by Dr. Fabio Pierce  Medical History: Cataracts BPH/urinary issues Heart Problem High Blood Pressure LDL  Review of Systems Negative Allergic/Immunologic Negative Cardiovascular Negative Constitutional Negative Ear, Nose, Mouth & Throat Negative Endocrine Negative Eyes Negative Gastrointestinal Negative Genitourinary Negative Hemotologic/Lymphatic Negative Integumentary Negative Musculoskeletal Negative Neurological Negative Psychiatry Negative Respiratory  Social   Current every day smoker   Medication Aspirin, Losartan Potassium, Vitamin E, Clopidogrel, Lovastatin, Tamsulosin, Finasteride,   Sx/Procedures Triple Bypass, Appendectomy, Hernia Sx, Tonsils/adnoids removed,   Drug Allergies   NKDA  History & Physical: Heent:  Cataract, Right eye NECK: supple without bruits LUNGS: lungs clear to auscultation CV: regular rate and rhythm Abdomen: soft and non-tender  Impression & Plan: Assessment: 1.  NUCLEAR SCLEROSIS AGE RELATED; Both Eyes (H25.13) 2.  DERMATOCHALASIS, no surgery; Right Upper Lid, Left Upper Lid (H02.831,  W25.852)  Plan: 1.  Cataract accounts for the patient's decreased vision. This visual impairment is not correctable with a tolerable change in glasses or contact lenses. Cataract surgery with an implantation of a new lens should significantly improve the visual and functional status of the patient. Discussed all risks, benefits, alternatives, and potential complications. Discussed the procedures and recovery. Patient desires to have surgery. A-scan ordered and performed today for intra-ocular lens calculations. The surgery will be performed in order to improve vision for driving, reading, and for eye examinations. Recommend phacoemulsification with intra-ocular lens. Right Eye worse - first. Dilates poorly - shugacaine by protocol. Malyugin Ring - Flomax. Omidira. 2.  Asymptomatic, recommend observation for now. Findings, prognosis and treatment options reviewed.

## 2019-05-16 ENCOUNTER — Other Ambulatory Visit (HOSPITAL_COMMUNITY): Payer: Self-pay | Admitting: Respiratory Therapy

## 2019-05-16 DIAGNOSIS — Z1383 Encounter for screening for respiratory disorder NEC: Secondary | ICD-10-CM

## 2019-05-20 DIAGNOSIS — H25811 Combined forms of age-related cataract, right eye: Secondary | ICD-10-CM | POA: Diagnosis not present

## 2019-05-20 DIAGNOSIS — H2181 Floppy iris syndrome: Secondary | ICD-10-CM | POA: Diagnosis not present

## 2019-05-23 ENCOUNTER — Other Ambulatory Visit (HOSPITAL_COMMUNITY)
Admission: RE | Admit: 2019-05-23 | Discharge: 2019-05-23 | Disposition: A | Discharge status: Home / Self Care | Payer: Medicare HMO | Source: Ambulatory Visit | Attending: Ophthalmology | Admitting: Ophthalmology

## 2019-05-23 ENCOUNTER — Encounter (HOSPITAL_COMMUNITY): Payer: Self-pay

## 2019-05-23 ENCOUNTER — Encounter (HOSPITAL_COMMUNITY)
Admission: RE | Admit: 2019-05-23 | Discharge: 2019-05-23 | Disposition: A | Discharge status: Home / Self Care | Payer: Medicare HMO | Source: Ambulatory Visit | Attending: Ophthalmology | Admitting: Ophthalmology

## 2019-05-23 ENCOUNTER — Other Ambulatory Visit: Payer: Self-pay

## 2019-05-23 DIAGNOSIS — Z01812 Encounter for preprocedural laboratory examination: Secondary | ICD-10-CM | POA: Insufficient documentation

## 2019-05-23 DIAGNOSIS — Z20822 Contact with and (suspected) exposure to covid-19: Secondary | ICD-10-CM | POA: Insufficient documentation

## 2019-05-23 HISTORY — DX: Gout, unspecified: M10.9

## 2019-05-23 HISTORY — DX: Benign prostatic hyperplasia without lower urinary tract symptoms: N40.0

## 2019-05-24 LAB — SARS CORONAVIRUS 2 (TAT 6-24 HRS): SARS Coronavirus 2: NEGATIVE

## 2019-05-27 ENCOUNTER — Encounter (HOSPITAL_COMMUNITY): Payer: Self-pay | Admitting: Ophthalmology

## 2019-05-27 ENCOUNTER — Ambulatory Visit (HOSPITAL_COMMUNITY): Payer: Medicare HMO | Admitting: Anesthesiology

## 2019-05-27 ENCOUNTER — Ambulatory Visit (HOSPITAL_COMMUNITY)
Admission: RE | Admit: 2019-05-27 | Discharge: 2019-05-27 | Disposition: A | Discharge status: Home / Self Care | Payer: Medicare HMO | Attending: Ophthalmology | Admitting: Ophthalmology

## 2019-05-27 ENCOUNTER — Encounter (HOSPITAL_COMMUNITY)
Admission: RE | Disposition: A | Discharge status: Home / Self Care | Payer: Self-pay | Source: Home / Self Care | Attending: Ophthalmology

## 2019-05-27 DIAGNOSIS — Z7982 Long term (current) use of aspirin: Secondary | ICD-10-CM | POA: Insufficient documentation

## 2019-05-27 DIAGNOSIS — H2511 Age-related nuclear cataract, right eye: Secondary | ICD-10-CM | POA: Insufficient documentation

## 2019-05-27 DIAGNOSIS — Z79899 Other long term (current) drug therapy: Secondary | ICD-10-CM | POA: Diagnosis not present

## 2019-05-27 DIAGNOSIS — I252 Old myocardial infarction: Secondary | ICD-10-CM | POA: Insufficient documentation

## 2019-05-27 DIAGNOSIS — Z7902 Long term (current) use of antithrombotics/antiplatelets: Secondary | ICD-10-CM | POA: Insufficient documentation

## 2019-05-27 DIAGNOSIS — Z951 Presence of aortocoronary bypass graft: Secondary | ICD-10-CM | POA: Diagnosis not present

## 2019-05-27 DIAGNOSIS — F17200 Nicotine dependence, unspecified, uncomplicated: Secondary | ICD-10-CM | POA: Diagnosis not present

## 2019-05-27 DIAGNOSIS — H25811 Combined forms of age-related cataract, right eye: Secondary | ICD-10-CM | POA: Diagnosis not present

## 2019-05-27 DIAGNOSIS — R69 Illness, unspecified: Secondary | ICD-10-CM | POA: Diagnosis not present

## 2019-05-27 DIAGNOSIS — I739 Peripheral vascular disease, unspecified: Secondary | ICD-10-CM | POA: Diagnosis not present

## 2019-05-27 DIAGNOSIS — H2181 Floppy iris syndrome: Secondary | ICD-10-CM | POA: Insufficient documentation

## 2019-05-27 DIAGNOSIS — I10 Essential (primary) hypertension: Secondary | ICD-10-CM | POA: Insufficient documentation

## 2019-05-27 HISTORY — PX: CATARACT EXTRACTION W/PHACO: SHX586

## 2019-05-27 SURGERY — PHACOEMULSIFICATION, CATARACT, WITH IOL INSERTION
Anesthesia: Monitor Anesthesia Care | Site: Eye | Laterality: Right

## 2019-05-27 MED ORDER — PHENYLEPHRINE-KETOROLAC 1-0.3 % IO SOLN
INTRAOCULAR | Status: DC | PRN
  Administered 2019-05-27: 10:00:00 500 mL via OPHTHALMIC

## 2019-05-27 MED ORDER — BSS IO SOLN
INTRAOCULAR | Status: DC | PRN
  Administered 2019-05-27: 15 mL via INTRAOCULAR

## 2019-05-27 MED ORDER — PHENYLEPHRINE HCL 2.5 % OP SOLN
1.0000 [drp] | OPHTHALMIC | Status: AC | PRN
  Administered 2019-05-27 (×3): 1 [drp] via OPHTHALMIC

## 2019-05-27 MED ORDER — TETRACAINE HCL 0.5 % OP SOLN
1.0000 [drp] | OPHTHALMIC | Status: AC | PRN
  Administered 2019-05-27 (×3): 1 [drp] via OPHTHALMIC

## 2019-05-27 MED ORDER — SODIUM HYALURONATE 23 MG/ML IO SOLN
INTRAOCULAR | Status: DC | PRN
  Administered 2019-05-27: 0.6 mL via INTRAOCULAR

## 2019-05-27 MED ORDER — LIDOCAINE HCL 3.5 % OP GEL
1.0000 "application " | Freq: Once | OPHTHALMIC | Status: AC
  Administered 2019-05-27: 1 via OPHTHALMIC

## 2019-05-27 MED ORDER — PROVISC 10 MG/ML IO SOLN
INTRAOCULAR | Status: DC | PRN
  Administered 2019-05-27: 0.85 mL via INTRAOCULAR

## 2019-05-27 MED ORDER — CYCLOPENTOLATE-PHENYLEPHRINE 0.2-1 % OP SOLN
1.0000 [drp] | OPHTHALMIC | Status: AC | PRN
  Administered 2019-05-27 (×3): 1 [drp] via OPHTHALMIC

## 2019-05-27 MED ORDER — NEOMYCIN-POLYMYXIN-DEXAMETH 3.5-10000-0.1 OP SUSP
OPHTHALMIC | Status: DC | PRN
  Administered 2019-05-27: 1 [drp] via OPHTHALMIC

## 2019-05-27 MED ORDER — POVIDONE-IODINE 5 % OP SOLN
OPHTHALMIC | Status: DC | PRN
  Administered 2019-05-27: 1 via OPHTHALMIC

## 2019-05-27 MED ORDER — LIDOCAINE HCL (PF) 1 % IJ SOLN
INTRAOCULAR | Status: DC | PRN
  Administered 2019-05-27: 1 mL via OPHTHALMIC

## 2019-05-27 SURGICAL SUPPLY — 14 items
CLOTH BEACON ORANGE TIMEOUT ST (SAFETY) ×1 IMPLANT
EYE SHIELD UNIVERSAL CLEAR (GAUZE/BANDAGES/DRESSINGS) ×1 IMPLANT
GLOVE BIOGEL PI IND STRL 7.0 (GLOVE) IMPLANT
GLOVE BIOGEL PI INDICATOR 7.0 (GLOVE) ×2
LENS ALC ACRYL/TECN (Ophthalmic Related) ×1 IMPLANT
NDL HYPO 18GX1.5 BLUNT FILL (NEEDLE) IMPLANT
NEEDLE HYPO 18GX1.5 BLUNT FILL (NEEDLE) ×2 IMPLANT
PAD ARMBOARD 7.5X6 YLW CONV (MISCELLANEOUS) ×1 IMPLANT
RING MALYGIN 7.0 (MISCELLANEOUS) ×1 IMPLANT
SYR TB 1ML LL NO SAFETY (SYRINGE) ×1 IMPLANT
TAPE SURG TRANSPORE 1 IN (GAUZE/BANDAGES/DRESSINGS) IMPLANT
TAPE SURGICAL TRANSPORE 1 IN (GAUZE/BANDAGES/DRESSINGS) ×1
VISCOELASTIC ADDITIONAL (OPHTHALMIC RELATED) ×1 IMPLANT
WATER STERILE IRR 250ML POUR (IV SOLUTION) ×1 IMPLANT

## 2019-05-27 NOTE — Transfer of Care (Signed)
Immediate Anesthesia Transfer of Care Note  Patient: Terry Fitzgerald  Procedure(s) Performed: CATARACT EXTRACTION PHACO AND INTRAOCULAR LENS PLACEMENT (IOC) (Right Eye)  Patient Location: Short Stay  Anesthesia Type:MAC  Level of Consciousness: awake, alert , oriented and patient cooperative  Airway & Oxygen Therapy: Patient Spontanous Breathing  Post-op Assessment: Report given to RN and Post -op Vital signs reviewed and stable  Post vital signs: Reviewed and stable  Last Vitals:  Vitals Value Taken Time  BP    Temp    Pulse    Resp    SpO2      Last Pain:  Vitals:   05/27/19 0852  TempSrc: Oral  PainSc: 0-No pain      Patients Stated Pain Goal: 9 (61/48/30 [id number])  Complications: No apparent anesthesia complications

## 2019-05-27 NOTE — Discharge Instructions (Signed)
Please discharge patient when stable, will follow up today with Dr. Tarita Deshmukh at the [facility] [place] office immediately following discharge.  Leave shield in place until visit.  All paperwork with discharge instructions will be given at the office.   [facility] Address:  [address]  [place], [place] [address]  

## 2019-05-27 NOTE — Anesthesia Postprocedure Evaluation (Signed)
Anesthesia Post Note  Patient: Terry Fitzgerald  Procedure(s) Performed: CATARACT EXTRACTION PHACO AND INTRAOCULAR LENS PLACEMENT ([place]) (Right Eye)  Patient location during evaluation: Short Stay Anesthesia Type: MAC Level of consciousness: awake and alert Pain management: pain level controlled Vital Signs Assessment: post-procedure vital signs reviewed and stable Respiratory status: spontaneous breathing Cardiovascular status: stable Postop Assessment: no apparent nausea or vomiting and adequate PO intake Anesthetic complications: no     Last Vitals:  Vitals:   05/27/19 0852 05/27/19 0957  BP: (!) 153/70 (!) 156/59  Pulse:  63  Resp: 14 16  Temp: 36.4 C 36.4 C  SpO2: 99% 100%    Last Pain:  Vitals:   05/27/19 0957  TempSrc: Oral  PainSc: 0-No pain                 Everette Rank

## 2019-05-27 NOTE — Op Note (Signed)
Date of procedure: 05/27/19  Pre-operative diagnosis: Visually significant cataract, Right Eye; Poor Dilation, Right Eye (H25.811; H21.81)  Post-operative diagnosis: Visually significant cataract, Right Eye; Intra-operative Floppy Iris Syndrome, Right Eye  Procedure: Removal of cataract via phacoemulsification and insertion of intra-ocular lens [employer] [id number]  +23.5D into the capsular bag of the Right Eye (CPT [phone])  Attending surgeon: Gerda Diss. Brelynn Wheller, MD, MA  Anesthesia: MAC, Topical Akten  Complications: None  Estimated Blood Loss: <81m (minimal)  Specimens: None  Implants: As above  Indications:  Visually significant cataract, Right Eye  Procedure:  The patient was seen and identified in the pre-operative area. The operative eye was identified and dilated.  The operative eye was marked.  Topical anesthesia was administered to the operative eye.     The patient was then to the operative suite and placed in the supine position.  A timeout was performed confirming the patient, procedure to be performed, and all other relevant information.   The patient's face was prepped and draped in the usual fashion for intra-ocular surgery.  A lid speculum was placed into the operative eye and the surgical microscope moved into place and focused.  Poor dilation of the iris was confirmed.  A superotemporal paracentesis was created using a 20 gauge paracentesis blade.  Shugarcaine was injected into the anterior chamber.  Viscoelastic was injected into the anterior chamber.  A temporal clear-corneal main wound incision was created using a 2.429mmicrokeratome.  A Malyugin ring was placed.  A continuous curvilinear capsulorrhexis was initiated using an irrigating cystitome and completed using capsulorrhexis forceps.  Hydrodissection and hydrodeliniation were performed.  Viscoelastic was injected into the anterior chamber.  A phacoemulsification handpiece and a chopper as a second  instrument were used to remove the nucleus and epinucleus. The irrigation/aspiration handpiece was used to remove any remaining cortical material.   The capsular bag was reinflated with viscoelastic, checked, and found to be intact.  The intraocular lens was inserted into the capsular bag and dialed into place using a Kuglen hook.  The Malyugin ring was removed.  The irrigation/aspiration handpiece was used to remove any remaining viscoelastic.  The clear corneal wound and paracentesis wounds were then hydrated and checked with Weck-Cels to be watertight.  The lid-speculum and drape were removed, and the patient's face was cleaned with a wet and dry 4x4.  Maxitrol was instilled in the eye before a clear shield was taped over the eye. The patient was taken to the post-operative care unit in good condition, having tolerated the procedure well.  Post-Op Instructions: [facility] and will receive all other orders and instructions.

## 2019-05-27 NOTE — Anesthesia Preprocedure Evaluation (Addendum)
Anesthesia Evaluation  Patient identified by MRN, date of birth, ID band Patient awake    Reviewed: Allergy & Precautions, NPO status , Patient's Chart, lab work & pertinent test results  Airway Mallampati: II  TM Distance: >3 FB Neck ROM: Full    Dental  (+) Edentulous Upper, Edentulous Lower   Pulmonary Current Smoker and Patient abstained from smoking.,    Pulmonary exam normal breath sounds clear to auscultation       Cardiovascular Exercise Tolerance: Good hypertension, Pt. on medications + Past MI, + CABG and + Peripheral Vascular Disease  Normal cardiovascular exam Rhythm:Regular Rate:Normal     Neuro/Psych negative neurological ROS  negative psych ROS   GI/Hepatic   Endo/Other  negative endocrine ROS  Renal/GU      Musculoskeletal Gout    Abdominal   Peds  Hematology negative hematology ROS (+)   Anesthesia Other Findings   Reproductive/Obstetrics negative OB ROS                            Anesthesia Physical Anesthesia Plan  ASA: III  Anesthesia Plan: MAC   Post-op Pain Management:    Induction: Intravenous  PONV Risk Score and Plan:   Airway Management Planned: Nasal Cannula and Natural Airway  Additional Equipment:   Intra-op Plan:   Post-operative Plan:   Informed Consent: I have reviewed the patients History and Physical, chart, labs and discussed the procedure including the risks, benefits and alternatives for the proposed anesthesia with the patient or authorized representative who has indicated his/her understanding and acceptance.     Dental advisory given  Plan Discussed with: CRNA and Surgeon  Anesthesia Plan Comments:         Anesthesia Quick Evaluation

## 2019-05-27 NOTE — Interval H&P Note (Signed)
History and Physical Interval Note: The H and P was reviewed and updated. The patient was examined.  No changes were found after exam.  The surgical eye was marked.   05/27/2019 9:26 AM  Terry Fitzgerald  has presented today for surgery, with the diagnosis of Nuclear sclerotic cataract - Right eye.  The various methods of treatment have been discussed with the patient and family. After consideration of risks, benefits and other options for treatment, the patient has consented to  Procedure(s) with comments: CATARACT EXTRACTION PHACO AND INTRAOCULAR LENS PLACEMENT (IOC) (Right) - right as a surgical intervention.  The patient's history has been reviewed, patient examined, no change in status, stable for surgery.  I have reviewed the patient's chart and labs.  Questions were answered to the patient's satisfaction.     Fabio Pierce

## 2019-06-05 NOTE — H&P (Signed)
Surgical History & Physical  Patient Name: Terry Fitzgerald DOB: 04/01/1935  Surgery: Cataract extraction with intraocular lens implant phacoemulsification; Left Eye  Surgeon: Fabio Pierce MD Surgery Date:  06/14/2019 Pre-Op Date:  06/03/2019  HPI: A 63 Yr. old male patient 1. 1. The patient is returning after cataract surgery. The right eye is affected. Status post cataract surgery on 05-27-2019: Onset was S/P STD. Since the last visit, the affected area feels improvement and is doing well. The patient's vision is improved. Patient is following medication instructions with 3 in 1 TID. Patient can see a big clarity difference between OU. OS cloudy, blurred c/w OD. This is negatively affecting the patient's quality of life. OD brighter and clearer since sx. No pain or discomfort.  Medical History: Cataracts BPH/urinary issues Heart Problem High Blood Pressure LDL  Review of Systems Negative Allergic/Immunologic Negative Cardiovascular Negative Constitutional Negative Ear, Nose, Mouth & Throat Negative Endocrine Negative Eyes Negative Gastrointestinal Negative Genitourinary Negative Hemotologic/Lymphatic Negative Integumentary Negative Musculoskeletal Negative Neurological Negative Psychiatry Negative Respiratory  Social   Current every day smoker   Medication Prednisolone acetate 1%, Moxifloxacin, Ilevro,  Aspirin, Losartan Potassium, Vitamin E, Clopidogrel, Lovastatin, Tamsulosin, Finasteride,   Sx/Procedures Phaco c IOL OD,  Triple Bypass, Appendectomy, Hernia Sx, Tonsils/adnoids removed,   Drug Allergies   NKDA  History & Physical: Heent:  Cataract, Left eye NECK: supple without bruits LUNGS: lungs clear to auscultation CV: regular rate and rhythm Abdomen: soft and non-tender  Impression & Plan: Assessment: 1.  NUCLEAR SCLEROSIS AGE RELATED; , Left Eye (H25.12) 2.  CATARACT EXTRACTION STATUS; Right Eye (Z98.41)  Plan: 1.  Cataract accounts for the  patient's decreased vision. This visual impairment is not correctable with a tolerable change in glasses or contact lenses. Cataract surgery with an implantation of a new lens should significantly improve the visual and functional status of the patient. Discussed all risks, benefits, alternatives, and potential complications. Discussed the procedures and recovery. Patient desires to have surgery. A-scan ordered and performed today for intra-ocular lens calculations. The surgery will be performed in order to improve vision for driving, reading, and for eye examinations. Recommend phacoemulsification with intra-ocular lens. Left Eye.. Dilates poorly - shugacaine by protocol. Malyugin Ring - Flomax. Omidira. 2.  1 week after cataract surgery. Doing well with improved vision and normal eye pressure. Call with any problems or concerns. Stop Vigamox. Continue Ilevro 1 drop 1x/day for 3 more weeks. Continue Pred Acetate 1 drop 2x/day for 3 more weeks.

## 2019-06-06 ENCOUNTER — Ambulatory Visit: Payer: Medicare HMO | Attending: Internal Medicine

## 2019-06-06 ENCOUNTER — Other Ambulatory Visit: Payer: Self-pay

## 2019-06-06 DIAGNOSIS — Z20822 Contact with and (suspected) exposure to covid-19: Secondary | ICD-10-CM

## 2019-06-07 DIAGNOSIS — I739 Peripheral vascular disease, unspecified: Secondary | ICD-10-CM | POA: Diagnosis not present

## 2019-06-07 DIAGNOSIS — M79606 Pain in leg, unspecified: Secondary | ICD-10-CM | POA: Diagnosis not present

## 2019-06-07 LAB — NOVEL CORONAVIRUS, NAA: SARS-CoV-2, NAA: NOT DETECTED

## 2019-06-07 LAB — SARS-COV-2, NAA 2 DAY TAT

## 2019-06-10 ENCOUNTER — Telehealth: Payer: Self-pay | Admitting: *Deleted

## 2019-06-10 DIAGNOSIS — H25812 Combined forms of age-related cataract, left eye: Secondary | ICD-10-CM | POA: Diagnosis not present

## 2019-06-10 NOTE — Telephone Encounter (Signed)
Pt's daughter, [place]  called to have result of COVID test faxed; she was given the number to [facility]; she verbalized understanding.

## 2019-06-10 NOTE — Telephone Encounter (Signed)
Daughter, Daymen Hassebrock called in for her father's COVID-19 result.   He was there and gave me verbal permission to release his result to [facility]. I let them know it was not detected meaning he does not have the virus.  I gave them the [facility] number so they could get a copy of the result sent to them.

## 2019-06-12 ENCOUNTER — Other Ambulatory Visit (HOSPITAL_COMMUNITY)
Admission: RE | Admit: 2019-06-12 | Discharge: 2019-06-12 | Disposition: A | Discharge status: Home / Self Care | Payer: Medicare HMO | Source: Ambulatory Visit | Attending: Ophthalmology | Admitting: Ophthalmology

## 2019-06-12 ENCOUNTER — Other Ambulatory Visit (HOSPITAL_COMMUNITY): Payer: Medicare HMO

## 2019-06-12 ENCOUNTER — Encounter (HOSPITAL_COMMUNITY)
Admission: RE | Admit: 2019-06-12 | Discharge: 2019-06-12 | Disposition: A | Discharge status: Home / Self Care | Payer: Medicare HMO | Source: Ambulatory Visit | Attending: Ophthalmology | Admitting: Ophthalmology

## 2019-06-12 ENCOUNTER — Other Ambulatory Visit: Payer: Self-pay

## 2019-06-12 DIAGNOSIS — Z20822 Contact with and (suspected) exposure to covid-19: Secondary | ICD-10-CM | POA: Diagnosis not present

## 2019-06-12 DIAGNOSIS — Z01812 Encounter for preprocedural laboratory examination: Secondary | ICD-10-CM | POA: Insufficient documentation

## 2019-06-13 LAB — SARS CORONAVIRUS 2 (TAT 6-24 HRS): SARS Coronavirus 2: NEGATIVE

## 2019-06-14 ENCOUNTER — Ambulatory Visit (HOSPITAL_COMMUNITY): Payer: Medicare HMO | Admitting: Anesthesiology

## 2019-06-14 ENCOUNTER — Ambulatory Visit (HOSPITAL_COMMUNITY)
Admission: RE | Admit: 2019-06-14 | Discharge: 2019-06-14 | Disposition: A | Discharge status: Home / Self Care | Payer: Medicare HMO | Attending: Ophthalmology | Admitting: Ophthalmology

## 2019-06-14 ENCOUNTER — Encounter (HOSPITAL_COMMUNITY)
Admission: RE | Disposition: A | Discharge status: Home / Self Care | Payer: Self-pay | Source: Home / Self Care | Attending: Ophthalmology

## 2019-06-14 ENCOUNTER — Encounter (HOSPITAL_COMMUNITY): Payer: Self-pay | Admitting: Ophthalmology

## 2019-06-14 DIAGNOSIS — I10 Essential (primary) hypertension: Secondary | ICD-10-CM | POA: Insufficient documentation

## 2019-06-14 DIAGNOSIS — I739 Peripheral vascular disease, unspecified: Secondary | ICD-10-CM | POA: Diagnosis not present

## 2019-06-14 DIAGNOSIS — F17200 Nicotine dependence, unspecified, uncomplicated: Secondary | ICD-10-CM | POA: Diagnosis not present

## 2019-06-14 DIAGNOSIS — R69 Illness, unspecified: Secondary | ICD-10-CM | POA: Diagnosis not present

## 2019-06-14 DIAGNOSIS — Z7982 Long term (current) use of aspirin: Secondary | ICD-10-CM | POA: Insufficient documentation

## 2019-06-14 DIAGNOSIS — I252 Old myocardial infarction: Secondary | ICD-10-CM | POA: Insufficient documentation

## 2019-06-14 DIAGNOSIS — Z79899 Other long term (current) drug therapy: Secondary | ICD-10-CM | POA: Insufficient documentation

## 2019-06-14 DIAGNOSIS — Z951 Presence of aortocoronary bypass graft: Secondary | ICD-10-CM | POA: Diagnosis not present

## 2019-06-14 DIAGNOSIS — H2181 Floppy iris syndrome: Secondary | ICD-10-CM | POA: Diagnosis not present

## 2019-06-14 DIAGNOSIS — H25812 Combined forms of age-related cataract, left eye: Secondary | ICD-10-CM | POA: Diagnosis not present

## 2019-06-14 DIAGNOSIS — N400 Enlarged prostate without lower urinary tract symptoms: Secondary | ICD-10-CM | POA: Insufficient documentation

## 2019-06-14 HISTORY — PX: CATARACT EXTRACTION W/PHACO: SHX586

## 2019-06-14 SURGERY — PHACOEMULSIFICATION, CATARACT, WITH IOL INSERTION
Anesthesia: Monitor Anesthesia Care | Site: Eye | Laterality: Left

## 2019-06-14 MED ORDER — PHENYLEPHRINE-KETOROLAC 1-0.3 % IO SOLN
INTRAOCULAR | Status: DC | PRN
  Administered 2019-06-14: 12:00:00 500 mL via OPHTHALMIC

## 2019-06-14 MED ORDER — NEOMYCIN-POLYMYXIN-DEXAMETH 3.5-10000-0.1 OP SUSP
OPHTHALMIC | Status: DC | PRN
  Administered 2019-06-14: 1 [drp] via OPHTHALMIC

## 2019-06-14 MED ORDER — PROVISC 10 MG/ML IO SOLN
INTRAOCULAR | Status: DC | PRN
  Administered 2019-06-14: 0.85 mL via INTRAOCULAR

## 2019-06-14 MED ORDER — POVIDONE-IODINE 5 % OP SOLN
OPHTHALMIC | Status: DC | PRN
  Administered 2019-06-14: 1 via OPHTHALMIC

## 2019-06-14 MED ORDER — PHENYLEPHRINE HCL 2.5 % OP SOLN
1.0000 [drp] | OPHTHALMIC | Status: AC | PRN
  Administered 2019-06-14 (×3): 1 [drp] via OPHTHALMIC

## 2019-06-14 MED ORDER — PHENYLEPHRINE-KETOROLAC 1-0.3 % IO SOLN
INTRAOCULAR | Status: AC
  Filled 2019-06-14: qty 4

## 2019-06-14 MED ORDER — TETRACAINE HCL 0.5 % OP SOLN
1.0000 [drp] | OPHTHALMIC | Status: AC | PRN
  Administered 2019-06-14 (×3): 1 [drp] via OPHTHALMIC

## 2019-06-14 MED ORDER — SODIUM HYALURONATE 23 MG/ML IO SOLN
INTRAOCULAR | Status: DC | PRN
  Administered 2019-06-14: 0.6 mL via INTRAOCULAR

## 2019-06-14 MED ORDER — LIDOCAINE HCL 3.5 % OP GEL
1.0000 "application " | Freq: Once | OPHTHALMIC | Status: AC
  Administered 2019-06-14: 1 via OPHTHALMIC

## 2019-06-14 MED ORDER — BSS IO SOLN
INTRAOCULAR | Status: DC | PRN
  Administered 2019-06-14: 15 mL via INTRAOCULAR

## 2019-06-14 MED ORDER — LIDOCAINE HCL (PF) 1 % IJ SOLN
INTRAOCULAR | Status: DC | PRN
  Administered 2019-06-14: 1 mL via OPHTHALMIC

## 2019-06-14 MED ORDER — CYCLOPENTOLATE-PHENYLEPHRINE 0.2-1 % OP SOLN
1.0000 [drp] | OPHTHALMIC | Status: AC | PRN
  Administered 2019-06-14 (×3): 1 [drp] via OPHTHALMIC

## 2019-06-14 SURGICAL SUPPLY — 13 items
CLOTH BEACON ORANGE TIMEOUT ST (SAFETY) ×2 IMPLANT
EYE SHIELD UNIVERSAL CLEAR (GAUZE/BANDAGES/DRESSINGS) ×2 IMPLANT
GLOVE BIOGEL PI IND STRL 7.0 (GLOVE) ×2 IMPLANT
GLOVE BIOGEL PI INDICATOR 7.0 (GLOVE) ×2
LENS ALC ACRYL/TECN (Ophthalmic Related) ×2 IMPLANT
NEEDLE HYPO 18GX1.5 BLUNT FILL (NEEDLE) ×2 IMPLANT
PAD ARMBOARD 7.5X6 YLW CONV (MISCELLANEOUS) ×2 IMPLANT
RING MALYGIN 7.0 (MISCELLANEOUS) ×2 IMPLANT
SYR TB 1ML LL NO SAFETY (SYRINGE) ×2 IMPLANT
TAPE SURG TRANSPORE 1 IN (GAUZE/BANDAGES/DRESSINGS) ×1 IMPLANT
TAPE SURGICAL TRANSPORE 1 IN (GAUZE/BANDAGES/DRESSINGS) ×1
VISCOELASTIC ADDITIONAL (OPHTHALMIC RELATED) ×2 IMPLANT
WATER STERILE IRR 250ML POUR (IV SOLUTION) ×2 IMPLANT

## 2019-06-14 NOTE — Anesthesia Postprocedure Evaluation (Signed)
Anesthesia Post Note  Patient: Terry Fitzgerald  Procedure(s) Performed: CATARACT EXTRACTION PHACO AND INTRAOCULAR LENS PLACEMENT LEFT EYE (Left Eye)  Patient location during evaluation: Short Stay Anesthesia Type: MAC Level of consciousness: awake and alert Pain management: pain level controlled Vital Signs Assessment: post-procedure vital signs reviewed and stable Respiratory status: spontaneous breathing Cardiovascular status: stable Postop Assessment: no apparent nausea or vomiting Anesthetic complications: no     Last Vitals:  Vitals:   06/14/19 1052 06/14/19 1100  BP: (!) 166/65   Pulse: 67   Resp: 18   SpO2: 98% 100%    Last Pain:  Vitals:   06/14/19 1052  PainSc: 0-No pain                 Everette Rank

## 2019-06-14 NOTE — Transfer of Care (Signed)
Immediate Anesthesia Transfer of Care Note  Patient: Terry Fitzgerald  Procedure(s) Performed: CATARACT EXTRACTION PHACO AND INTRAOCULAR LENS PLACEMENT LEFT EYE (Left Eye)  Patient Location: Short Stay  Anesthesia Type:MAC  Level of Consciousness: awake, alert , oriented and patient cooperative  Airway & Oxygen Therapy: Patient Spontanous Breathing  Post-op Assessment: Report given to RN and Post -op Vital signs reviewed and stable  Post vital signs: Reviewed and stable  Last Vitals:  Vitals Value Taken Time  BP    Temp    Pulse    Resp    SpO2      Last Pain:  Vitals:   06/14/19 1052  PainSc: 0-No pain         Complications: No apparent anesthesia complications

## 2019-06-14 NOTE — Discharge Instructions (Signed)
Please discharge patient when stable, will follow up today with Dr. Evander Macaraeg at the [facility] office immediately following discharge.  Leave shield in place until visit.  All paperwork with discharge instructions will be given at the office.  [facility] Address:  [address]  [place], [place] [address]  

## 2019-06-14 NOTE — Anesthesia Preprocedure Evaluation (Signed)
Anesthesia Evaluation  Patient identified by MRN, date of birth, ID band Patient awake    Reviewed: Allergy & Precautions, NPO status , Patient's Chart, lab work & pertinent test results  Airway Mallampati: II  TM Distance: >3 FB Neck ROM: Full    Dental  (+) Edentulous Upper, Edentulous Lower   Pulmonary Current Smoker and Patient abstained from smoking.,    Pulmonary exam normal breath sounds clear to auscultation       Cardiovascular Exercise Tolerance: Good hypertension, Pt. on medications + Past MI, + CABG and + Peripheral Vascular Disease  Normal cardiovascular exam Rhythm:Regular Rate:Normal     Neuro/Psych negative neurological ROS  negative psych ROS   GI/Hepatic negative GI ROS, Neg liver ROS,   Endo/Other  negative endocrine ROS  Renal/GU negative Renal ROS     Musculoskeletal Gout    Abdominal   Peds  Hematology negative hematology ROS (+)   Anesthesia Other Findings   Reproductive/Obstetrics negative OB ROS                             Anesthesia Physical  Anesthesia Plan  ASA: III  Anesthesia Plan: MAC   Post-op Pain Management:    Induction: Intravenous  PONV Risk Score and Plan:   Airway Management Planned: Nasal Cannula and Natural Airway  Additional Equipment:   Intra-op Plan:   Post-operative Plan:   Informed Consent: I have reviewed the patients History and Physical, chart, labs and discussed the procedure including the risks, benefits and alternatives for the proposed anesthesia with the patient or authorized representative who has indicated his/her understanding and acceptance.     Dental advisory given  Plan Discussed with: CRNA and Surgeon  Anesthesia Plan Comments:         Anesthesia Quick Evaluation

## 2019-06-14 NOTE — Interval H&P Note (Signed)
History and Physical Interval Note: The H and P was reviewed and updated. The patient was examined.  No changes were found after exam.  The surgical eye was marked.  06/14/2019 11:18 AM  Terry Fitzgerald  has presented today for surgery, with the diagnosis of Nuclear sclerotic cataract - Left eye.  The various methods of treatment have been discussed with the patient and family. After consideration of risks, benefits and other options for treatment, the patient has consented to  Procedure(s) with comments: CATARACT EXTRACTION PHACO AND INTRAOCULAR LENS PLACEMENT (IOC) (Left) - CDE:  as a surgical intervention.  The patient's history has been reviewed, patient examined, no change in status, stable for surgery.  I have reviewed the patient's chart and labs.  Questions were answered to the patient's satisfaction.     Fabio Pierce

## 2019-06-14 NOTE — Op Note (Signed)
Date of procedure: 06/14/19  Pre-operative diagnosis: Visually significant combined-form age related cataract, Left Eye; Poor dilation, Left eye (H25.812; H21.81)  Post-operative diagnosis: Visually significant cataract, Left Eye; Intra-operative Floppy Iris Syndrome, Left Eye  Procedure: Complex removal of cataract via phacoemulsification and insertion of intra-ocular lens Johnson and Willis  +23.5D into the capsular bag of the Left Eye (CPT [phone])  Attending surgeon: Gerda Diss. Ethleen Lormand, MD, MA  Anesthesia: MAC, Topical Akten  Complications: None  Estimated Blood Loss: <73m (minimal)  Specimens: None  Implants: As above  Indications:  Visually significant cataract, Left Eye  Procedure:  The patient was seen and identified in the pre-operative area. The operative eye was identified and dilated.  The operative eye was marked.  Topical anesthesia was administered to the operative eye.     The patient was then to the operative suite and placed in the supine position.  A timeout was performed confirming the patient, procedure to be performed, and all other relevant information.   The patient's face was prepped and draped in the usual fashion for intra-ocular surgery.  A lid speculum was placed into the operative eye and the surgical microscope moved into place and focused.  Poor dilation of the iris was confirmed.  An inferotemporal paracentesis was created using a 20 gauge paracentesis blade.  Shugarcaine was injected into the anterior chamber.  Viscoelastic was injected into the anterior chamber.  A temporal clear-corneal main wound incision was created using a 2.436mmicrokeratome.  A Malyugin ring was placed.  A continuous curvilinear capsulorrhexis was initiated using an irrigating cystitome and completed using capsulorrhexis forceps.  Hydrodissection and hydrodeliniation were performed.  Viscoelastic was injected into the anterior chamber.  A phacoemulsification handpiece and a  chopper as a second instrument were used to remove the nucleus and epinucleus. The irrigation/aspiration handpiece was used to remove any remaining cortical material.   The capsular bag was reinflated with viscoelastic, checked, and found to be intact.  The intraocular lens was inserted into the capsular bag and dialed into place using a Kuglen hook. The Malyugin ring was removed.  The irrigation/aspiration handpiece was used to remove any remaining viscoelastic.  The clear corneal wound and paracentesis wounds were then hydrated and checked with Weck-Cels to be watertight.  The lid-speculum and drape was removed, and the patient's face was cleaned with a wet and dry 4x4.  Maxitrol was instilled in the eye before a clear shield was taped over the eye. The patient was taken to the post-operative care unit in good condition, having tolerated the procedure well.  Post-Op Instructions: [facility] and will receive all other orders and instructions.

## 2019-06-18 ENCOUNTER — Ambulatory Visit (HOSPITAL_COMMUNITY): Admission: RE | Admit: 2019-06-18 | Payer: Medicare HMO | Source: Ambulatory Visit

## 2019-06-18 ENCOUNTER — Ambulatory Visit (HOSPITAL_COMMUNITY)
Admission: RE | Admit: 2019-06-18 | Discharge: 2019-06-18 | Disposition: A | Discharge status: Home / Self Care | Payer: Medicare HMO | Source: Ambulatory Visit | Attending: Internal Medicine | Admitting: Internal Medicine

## 2019-06-18 ENCOUNTER — Other Ambulatory Visit: Payer: Self-pay

## 2019-06-18 DIAGNOSIS — R942 Abnormal results of pulmonary function studies: Secondary | ICD-10-CM | POA: Diagnosis not present

## 2019-06-18 DIAGNOSIS — Z1383 Encounter for screening for respiratory disorder NEC: Secondary | ICD-10-CM | POA: Diagnosis present

## 2019-06-18 LAB — PULMONARY FUNCTION TEST
DL/VA % pred: 64 %
DL/VA: 2.55 ml/min/mmHg/L
DLCO unc % pred: 60 %
DLCO unc: 13.4 ml/min/mmHg
FEF 25-75 Post: 3.13 L/sec
FEF 25-75 Pre: 2.1 L/sec
FEF2575-%Change-Post: 48 %
FEF2575-%Pred-Post: 189 %
FEF2575-%Pred-Pre: 127 %
FEV1-%Change-Post: 10 %
FEV1-%Pred-Post: 110 %
FEV1-%Pred-Pre: 100 %
FEV1-Post: 2.71 L
FEV1-Pre: 2.45 L
FEV1FVC-%Change-Post: -1 %
FEV1FVC-%Pred-Pre: 107 %
FEV6-%Change-Post: 11 %
FEV6-%Pred-Post: 109 %
FEV6-%Pred-Pre: 97 %
FEV6-Post: 3.53 L
FEV6-Pre: 3.17 L
FEV6FVC-%Change-Post: -1 %
FEV6FVC-%Pred-Post: 105 %
FEV6FVC-%Pred-Pre: 106 %
FVC-%Change-Post: 12 %
FVC-%Pred-Post: 103 %
FVC-%Pred-Pre: 92 %
FVC-Post: 3.62 L
FVC-Pre: 3.22 L
Post FEV1/FVC ratio: 75 %
Post FEV6/FVC ratio: 97 %
Pre FEV1/FVC ratio: 76 %
Pre FEV6/FVC Ratio: 98 %
RV % pred: 145 %
RV: 3.67 L
TLC % pred: 107 %
TLC: 6.93 L

## 2019-06-18 MED ORDER — ALBUTEROL SULFATE (2.5 MG/3ML) 0.083% IN NEBU
2.5000 mg | INHALATION_SOLUTION | Freq: Once | RESPIRATORY_TRACT | Status: AC
  Administered 2019-06-18: 2.5 mg via RESPIRATORY_TRACT

## 2019-07-08 ENCOUNTER — Other Ambulatory Visit (HOSPITAL_COMMUNITY): Payer: Medicare HMO

## 2019-07-11 ENCOUNTER — Encounter (HOSPITAL_COMMUNITY): Payer: Medicare HMO

## 2019-07-11 DIAGNOSIS — Z6829 Body mass index (BMI) 29.0-29.9, adult: Secondary | ICD-10-CM | POA: Diagnosis not present

## 2019-07-11 DIAGNOSIS — I7389 Other specified peripheral vascular diseases: Secondary | ICD-10-CM | POA: Diagnosis not present

## 2019-07-11 DIAGNOSIS — M545 Low back pain: Secondary | ICD-10-CM | POA: Diagnosis not present

## 2019-07-11 DIAGNOSIS — I10 Essential (primary) hypertension: Secondary | ICD-10-CM | POA: Diagnosis not present

## 2019-07-11 DIAGNOSIS — E782 Mixed hyperlipidemia: Secondary | ICD-10-CM | POA: Diagnosis not present

## 2019-07-23 ENCOUNTER — Ambulatory Visit (INDEPENDENT_AMBULATORY_CARE_PROVIDER_SITE_OTHER): Payer: Medicare HMO | Admitting: Vascular Surgery

## 2019-07-23 ENCOUNTER — Other Ambulatory Visit: Payer: Self-pay

## 2019-07-23 ENCOUNTER — Encounter: Payer: Self-pay | Admitting: Vascular Surgery

## 2019-07-23 DIAGNOSIS — I739 Peripheral vascular disease, unspecified: Secondary | ICD-10-CM

## 2019-07-23 NOTE — Progress Notes (Signed)
Patient name: Terry Fitzgerald MRN: [id number] DOB: 11/30/1935 Sex: male  REASON FOR CONSULT: Evaluate for PAD  HPI: Terry Fitzgerald is a 84 y.o. male, with history of hypertension, hyperlipidemia, coronary artery disease status post CABG, peripheral arterial disease, tobacco abuse that presents for evaluation of PAD.  Patient was referred by his primary care doctor in [place].  He states that his legs feel weak at times.  He does not pay attention to the symptoms to really be able to describe exacerbating factors.  Can't tell if one leg is worse.  When asked about burning in the calf or the thigh he states sometimes but again he is not paying much attention to the symptoms.  Not disabling at this time.  No rest pain in the feet at night.  No nonhealing wounds.  States he did have stents in both legs in [facility] [place] about 7 years ago as well as a likely carotid artery stent through a transfemoral approach to his recollection.  Sounds like he has had no surveillance over the last 6 to 7 years.  Smokes about a pack every couple days.  He is on aspirin Plavix statin.  He had imaging done at [facility] with ABIs of 0.67 on the right 0.55 on the left with monophasic waveforms at the ankle.  Past Medical History:  Diagnosis Date   BPH (benign prostatic hyperplasia)    Gout, unspecified    history x1   Hypertension    Myocardial infarction [facility]) 2011    Past Surgical History:  Procedure Laterality Date   APPENDECTOMY     CATARACT EXTRACTION W/PHACO Right 05/27/2019   Procedure: CATARACT EXTRACTION PHACO AND INTRAOCULAR LENS PLACEMENT (IOC);  Surgeon: Fabio Pierce, MD;  Location: [facility];  Service: Ophthalmology;  Laterality: Right;  CDE: 15.37   CATARACT EXTRACTION W/PHACO Left 06/14/2019   Procedure: CATARACT EXTRACTION PHACO AND INTRAOCULAR LENS PLACEMENT LEFT EYE;  Surgeon: Fabio Pierce, MD;  Location: [facility];  Service: Ophthalmology;  Laterality: Left;  CDE: 20.79   CORONARY  ARTERY BYPASS GRAFT     triple bypass 2011   HERNIA REPAIR     Rih   TONSILLECTOMY      No family history on file.  SOCIAL HISTORY: Social History   Socioeconomic History   Marital status: Widowed    Spouse name: Not on file   Number of children: Not on file   Years of education: Not on file   Highest education level: Not on file  Occupational History   Not on file  Tobacco Use   Smoking status: Current Every Day Smoker    Packs/day: 0.50    Years: 65.00    Pack years: 32.50    Types: Cigarettes   Smokeless tobacco: Never Used  Substance and Sexual Activity   Alcohol use: Not Currently   Drug use: Never   Sexual activity: Not Currently  Other Topics Concern   Not on file  Social History Narrative   Not on file   Social Determinants of Health   Financial Resource Strain:    Difficulty of Paying Living Expenses:   Food Insecurity:    Worried About Programme researcher, broadcasting/film/video in the Last Year:    Barista in the Last Year:   Transportation Needs:    Freight forwarder (Medical):    Lack of Transportation (Non-Medical):   Physical Activity:    Days of Exercise per Week:    Minutes of Exercise per  Session:   Stress:    Feeling of Stress :   Social Connections:    Frequency of Communication with Friends and Family:    Frequency of Social Gatherings with Friends and Family:    Attends Religious Services:    Active Member of Clubs or Organizations:    Attends Archivist Meetings:    Marital Status:   Intimate Partner Violence:    Fear of Current or Ex-Partner:    Emotionally Abused:    Physically Abused:    Sexually Abused:     Allergies  Allergen Reactions   Drug Class [Morphine And Related] Other (See Comments)    Hallucinations.    Current Outpatient Medications  Medication Sig Dispense Refill   aspirin 325 MG tablet Take 325 mg by mouth every evening.     clopidogrel (PLAVIX) 75 MG tablet Take 75 mg  by mouth daily.     finasteride (PROSCAR) 5 MG tablet Take 5 mg by mouth every evening.     ILEVRO 0.3 % ophthalmic suspension Place 1 drop into the right eye daily.     losartan (COZAAR) 100 MG tablet Take 100 mg by mouth daily.     lovastatin (MEVACOR) 20 MG tablet Take 20 mg by mouth daily.     moxifloxacin (VIGAMOX) 0.5 % ophthalmic solution Apply 1 drop to eye 3 (three) times daily.     prednisoLONE acetate (PRED FORTE) 1 % ophthalmic suspension Place 1 drop into the right eye 3 (three) times daily.     tamsulosin (FLOMAX) 0.4 MG CAPS capsule Take 0.4 mg by mouth daily.     No current facility-administered medications for this visit.    REVIEW OF SYSTEMS:  [X]  denotes positive finding, [ ]  denotes negative finding Cardiac  Comments:  Chest pain or chest pressure:    Shortness of breath upon exertion:    Short of breath when lying flat:    Irregular heart rhythm:        Vascular    Pain in calf, thigh, or hip brought on by ambulation: x   Pain in feet at night that wakes you up from your sleep:     Blood clot in your veins:    Leg swelling:         Pulmonary    Oxygen at home:    Productive cough:     Wheezing:         Neurologic    Sudden weakness in arms or legs:     Sudden numbness in arms or legs:     Sudden onset of difficulty speaking or slurred speech:    Temporary loss of vision in one eye:     Problems with dizziness:         Gastrointestinal    Blood in stool:     Vomited blood:         Genitourinary    Burning when urinating:     Blood in urine:        Psychiatric    Major depression:         Hematologic    Bleeding problems:    Problems with blood clotting too easily:        Skin    Rashes or ulcers:        Constitutional    Fever or chills:      PHYSICAL EXAM: There were no vitals filed for this visit.  GENERAL: The patient is a well-nourished male, in no acute distress.  The vital signs are documented above. CARDIAC: There is a  regular rate and rhythm.  VASCULAR:  Palpable femoral pulses bilaterally No palpable popliteal or pedal pulses No lower extremity tissue loss PULMONARY: There is good air exchange bilaterally without wheezing or rales. ABDOMEN: Soft and non-tender with normal pitched bowel sounds.  MUSCULOSKELETAL: There are no major deformities or cyanosis. NEUROLOGIC: No focal weakness or paresthesias are detected. SKIN: There are no ulcers or rashes noted. PSYCHIATRIC: The patient has a normal affect.  DATA:   ABIs from [facility] show ABI of 0.67 on the right and 0.55 on the left with monophasic waveforms at the ankle.  Assessment/Plan:  84 year old male presents for evaluation of PAD.  In discussing further with the patient sounds like he had bilateral lower extremity arterial stents as well as a possible carotid stent in [place], [place] approximately 7 years ago.  I do not have any records from his previous surgeries and he is a fairly poor historian.  Our office will attempt to get some records from [place], [place] so we can better understand what he has had done in the past.  In addition have ordered a carotid duplex as well as bilateral lower extremity arterial duplex for surveillance given his previous interventions and no surveillance over past 6-7 years.  Based on the symptoms he describes today, I do not think he needs any intervention at this time given he has these vague symptoms of leg weakness and unclear if this is true claudication since he states he really does not really pay attention to the symptoms and does not sound disabling.  Certainly no evidence of rest pain or tissue loss to suggest critical limb ischemia at this time.  I will have him follow-up with me after his carotid duplex and lower extremity arterial duplex are complete and we will go over the results and decide on future surveillance.  Will hopefully have his records as well.  Did discuss the importance of smoking  cessation.     Cephus Shelling, MD [facility] Office: [phone]

## 2019-07-24 DIAGNOSIS — M5137 Other intervertebral disc degeneration, lumbosacral region: Secondary | ICD-10-CM | POA: Diagnosis not present

## 2019-07-24 DIAGNOSIS — M9903 Segmental and somatic dysfunction of lumbar region: Secondary | ICD-10-CM | POA: Diagnosis not present

## 2019-07-24 DIAGNOSIS — M9901 Segmental and somatic dysfunction of cervical region: Secondary | ICD-10-CM | POA: Diagnosis not present

## 2019-07-24 DIAGNOSIS — M9902 Segmental and somatic dysfunction of thoracic region: Secondary | ICD-10-CM | POA: Diagnosis not present

## 2019-07-26 ENCOUNTER — Other Ambulatory Visit: Payer: Self-pay | Admitting: *Deleted

## 2019-07-26 DIAGNOSIS — R0989 Other specified symptoms and signs involving the circulatory and respiratory systems: Secondary | ICD-10-CM

## 2019-07-26 DIAGNOSIS — I739 Peripheral vascular disease, unspecified: Secondary | ICD-10-CM

## 2019-08-13 ENCOUNTER — Ambulatory Visit (HOSPITAL_COMMUNITY)
Admission: RE | Admit: 2019-08-13 | Discharge: 2019-08-13 | Disposition: A | Discharge status: Home / Self Care | Payer: Medicare HMO | Source: Ambulatory Visit | Attending: Vascular Surgery | Admitting: Vascular Surgery

## 2019-08-13 ENCOUNTER — Other Ambulatory Visit: Payer: Self-pay

## 2019-08-13 ENCOUNTER — Encounter: Payer: Self-pay | Admitting: Vascular Surgery

## 2019-08-13 ENCOUNTER — Ambulatory Visit (INDEPENDENT_AMBULATORY_CARE_PROVIDER_SITE_OTHER): Payer: Medicare HMO | Admitting: Vascular Surgery

## 2019-08-13 ENCOUNTER — Ambulatory Visit (INDEPENDENT_AMBULATORY_CARE_PROVIDER_SITE_OTHER)
Admission: RE | Admit: 2019-08-13 | Discharge: 2019-08-13 | Disposition: A | Discharge status: Home / Self Care | Payer: Medicare HMO | Source: Ambulatory Visit | Attending: Vascular Surgery | Admitting: Vascular Surgery

## 2019-08-13 VITALS — BP 145/66 | HR 70 | Temp 97.3°F | Resp 16 | Ht 67.0 in | Wt 185.0 lb

## 2019-08-13 DIAGNOSIS — I739 Peripheral vascular disease, unspecified: Secondary | ICD-10-CM | POA: Diagnosis not present

## 2019-08-13 DIAGNOSIS — I6523 Occlusion and stenosis of bilateral carotid arteries: Secondary | ICD-10-CM

## 2019-08-13 DIAGNOSIS — I779 Disorder of arteries and arterioles, unspecified: Secondary | ICD-10-CM | POA: Insufficient documentation

## 2019-08-13 DIAGNOSIS — R0989 Other specified symptoms and signs involving the circulatory and respiratory systems: Secondary | ICD-10-CM | POA: Diagnosis not present

## 2019-08-13 NOTE — Progress Notes (Signed)
Patient name: Terry Fitzgerald MRN: [id number] DOB: Mar 24, 1935 Sex: male  REASON FOR CONSULT: F/U for PAD and carotid disease  HPI: Terry Fitzgerald is a 84 y.o. male, with history of hypertension, hyperlipidemia, coronary artery disease status post CABG, peripheral arterial disease, tobacco abuse that presents for further follow-up of his PAD.  He was initially seen several weeks ago after referral from his PCP and felt that his legs feel weak at times.  He had no rest pain or nonhealing wounds otherwise.  He had a history of claudication.  He described a bunch of procedures in [place] [facility] and we had no records of this.  He had no surveillance over the last 6 to 7 years.  He had imaging done at [facility] with ABIs of 0.67 on the right 0.55 on the left with monophasic waveforms at the ankle.  Ultimately we were able to get records from [place] and in review of the records he has had a left common iliac artery angioplasty with stent placement in 2010 for claudication this appears to be an 8 x 20.  He also had a left SFA angioplasty that was done in 2013.  Also an operative note for coronary bypass grafting x3 with left internal mammary to the LAD, saphenous vein graft to the OM and saphenous vein graft to the PDA in 2010.  His daughter is here with him today and provides some additional information and thinks he had bilateral carotid endarterectomies and then subsequent right carotid stent from a transfemoral approach.  I do not have any records from the carotid surgery.  Past Medical History:  Diagnosis Date  . BPH (benign prostatic hyperplasia)   . Gout, unspecified    history x1  . Hypertension   . Myocardial infarction [facility]) 2011    Past Surgical History:  Procedure Laterality Date  . APPENDECTOMY    . CATARACT EXTRACTION W/PHACO Right 05/27/2019   Procedure: CATARACT EXTRACTION PHACO AND INTRAOCULAR LENS PLACEMENT (IOC);  Surgeon: Fabio Pierce, MD;  Location: [facility];   Service: Ophthalmology;  Laterality: Right;  CDE: 15.37  . CATARACT EXTRACTION W/PHACO Left 06/14/2019   Procedure: CATARACT EXTRACTION PHACO AND INTRAOCULAR LENS PLACEMENT LEFT EYE;  Surgeon: Fabio Pierce, MD;  Location: [facility];  Service: Ophthalmology;  Laterality: Left;  CDE: 20.79  . CORONARY ARTERY BYPASS GRAFT     triple bypass 2011  . HERNIA REPAIR     Rih  . TONSILLECTOMY      History reviewed. No pertinent family history.  SOCIAL HISTORY: Social History   Socioeconomic History  . Marital status: Widowed    Spouse name: Not on file  . Number of children: Not on file  . Years of education: Not on file  . Highest education level: Not on file  Occupational History  . Not on file  Tobacco Use  . Smoking status: Current Every Day Smoker    Packs/day: 0.50    Years: 65.00    Pack years: 32.50    Types: Cigarettes  . Smokeless tobacco: Never Used  Vaping Use  . Vaping Use: Never used  Substance and Sexual Activity  . Alcohol use: Not Currently  . Drug use: Never  . Sexual activity: Not Currently  Other Topics Concern  . Not on file  Social History Narrative  . Not on file   Social Determinants of Health   Financial Resource Strain:   . Difficulty of Paying Living Expenses:   Food Insecurity:   .  Worried About Programme researcher, broadcasting/film/video in the Last Year:   . Barista in the Last Year:   Transportation Needs:   . Freight forwarder (Medical):   Marland Kitchen Lack of Transportation (Non-Medical):   Physical Activity:   . Days of Exercise per Week:   . Minutes of Exercise per Session:   Stress:   . Feeling of Stress :   Social Connections:   . Frequency of Communication with Friends and Family:   . Frequency of Social Gatherings with Friends and Family:   . Attends Religious Services:   . Active Member of Clubs or Organizations:   . Attends Banker Meetings:   Marland Kitchen Marital Status:   Intimate Partner Violence:   . Fear of Current or Ex-Partner:   .  Emotionally Abused:   Marland Kitchen Physically Abused:   . Sexually Abused:     Allergies  Allergen Reactions  . Drug Class [Morphine And Related] Other (See Comments)    Hallucinations.    Current Outpatient Medications  Medication Sig Dispense Refill  . aspirin 325 MG tablet Take 325 mg by mouth every evening.    . clopidogrel (PLAVIX) 75 MG tablet Take 75 mg by mouth daily.    . finasteride (PROSCAR) 5 MG tablet Take 5 mg by mouth every evening.    . ILEVRO 0.3 % ophthalmic suspension Place 1 drop into the right eye daily.    Marland Kitchen losartan (COZAAR) 100 MG tablet Take 100 mg by mouth daily.    Marland Kitchen lovastatin (MEVACOR) 20 MG tablet Take 20 mg by mouth daily.    Marland Kitchen moxifloxacin (VIGAMOX) 0.5 % ophthalmic solution Apply 1 drop to eye 3 (three) times daily.    . Omega-3 Fatty Acids (FISH OIL) 1000 MG CAPS Take 1,000 capsules by mouth 2 (two) times daily.    . prednisoLONE acetate (PRED FORTE) 1 % ophthalmic suspension Place 1 drop into the right eye 3 (three) times daily.    . tamsulosin (FLOMAX) 0.4 MG CAPS capsule Take 0.4 mg by mouth daily.     No current facility-administered medications for this visit.    REVIEW OF SYSTEMS:  [X]  denotes positive finding, [ ]  denotes negative finding Cardiac  Comments:  Chest pain or chest pressure:    Shortness of breath upon exertion:    Short of breath when lying flat:    Irregular heart rhythm:        Vascular    Pain in calf, thigh, or hip brought on by ambulation: x   Pain in feet at night that wakes you up from your sleep:     Blood clot in your veins:    Leg swelling:         Pulmonary    Oxygen at home:    Productive cough:     Wheezing:         Neurologic    Sudden weakness in arms or legs:     Sudden numbness in arms or legs:     Sudden onset of difficulty speaking or slurred speech:    Temporary loss of vision in one eye:     Problems with dizziness:         Gastrointestinal    Blood in stool:     Vomited blood:           Genitourinary    Burning when urinating:     Blood in urine:        Psychiatric  Major depression:         Hematologic    Bleeding problems:    Problems with blood clotting too easily:        Skin    Rashes or ulcers:        Constitutional    Fever or chills:      PHYSICAL EXAM: Vitals:   08/13/19 1322 08/13/19 1326  BP: 120/60 (!) 145/66  Pulse: 70 70  Resp: 16   Temp: (!) 97.3 F (36.3 C)   TempSrc: Temporal   SpO2: 100%   Weight: 185 lb (83.9 kg)   Height: 5\' 7"  (1.702 m)     GENERAL: The patient is a well-nourished male, in no acute distress. The vital signs are documented above. CARDIAC: There is a regular rate and rhythm.  VASCULAR:  Bilateral neck incisions Palpable femoral pulses bilaterally No palpable popliteal or pedal pulses No lower extremity tissue loss PULMONARY: There is good air exchange bilaterally without wheezing or rales. ABDOMEN: Soft and non-tender with normal pitched bowel sounds.  MUSCULOSKELETAL: There are no major deformities or cyanosis. NEUROLOGIC: No focal weakness or paresthesias are detected.   DATA:   ABIs from [facility] show ABI of 0.67 on the right and 0.55 on the left with monophasic waveforms at the ankle.  Bilateral lower extremity arterial duplexes today show moderate disease in bilateral SFAs with likely underlying tibial disease  Carotid duplex today shows high-grade stenosis in the proximal right carotid stent with velocities of 428/130 and no significant disease in the left ICA  Assessment/Plan:  84 year old male initially presented for evaluation of PAD.  Ultimately we were able to get records and sounds like he had a left SFA angioplasty and a left common iliac artery stent in [facility] for claudication.  On further evaluation he has had bilateral carotid endarterectomies and a subsequent right carotid stent.  I discussed with him and his daughter today that I think given his leg symptoms are very  stable and he is mowing his grass and doing his other activities of daily living at home I think there is no role for lower extremity intervention at this time.  No signs of critical limb ischemia at this time with no rest pain or tissue loss.  I think the more pressing issue is the high-grade stenosis identified in his right ICA stent that was placed in [facility] years ago.  Duplex today suggest velocities greater than 75% stenosis in the proximal portion of the stent.  I have subsequently recommended CTA neck to further evaluate this and to see if he would be a candidate for TCAR.  Discussed that if he has an appropriate landing zone in the common carotid we may evaluate TCAR to either perform balloon angioplasty or relign the stent pending the degree of narrowing.  He will remain on aspirin Plavix and statin.  I also emphasized importance of smoking cessation.  He  will follow-up with me after CT is obtained we can further discuss findings.   [place], MD [facility] [place] Office: [phone]

## 2019-08-14 ENCOUNTER — Encounter: Payer: Self-pay | Admitting: Vascular Surgery

## 2019-08-15 DIAGNOSIS — E7800 Pure hypercholesterolemia, unspecified: Secondary | ICD-10-CM | POA: Diagnosis not present

## 2019-08-15 DIAGNOSIS — H524 Presbyopia: Secondary | ICD-10-CM | POA: Diagnosis not present

## 2019-08-15 DIAGNOSIS — Z961 Presence of intraocular lens: Secondary | ICD-10-CM | POA: Diagnosis not present

## 2019-08-15 DIAGNOSIS — I10 Essential (primary) hypertension: Secondary | ICD-10-CM | POA: Diagnosis not present

## 2019-08-15 DIAGNOSIS — Z0100 Encounter for examination of eyes and vision without abnormal findings: Secondary | ICD-10-CM | POA: Diagnosis not present

## 2019-08-20 ENCOUNTER — Other Ambulatory Visit: Payer: Self-pay

## 2019-08-20 DIAGNOSIS — I6523 Occlusion and stenosis of bilateral carotid arteries: Secondary | ICD-10-CM

## 2019-08-20 DIAGNOSIS — R0989 Other specified symptoms and signs involving the circulatory and respiratory systems: Secondary | ICD-10-CM

## 2019-09-04 ENCOUNTER — Other Ambulatory Visit: Payer: Medicare HMO

## 2019-09-04 ENCOUNTER — Encounter: Payer: Self-pay | Admitting: Vascular Surgery

## 2019-09-09 ENCOUNTER — Ambulatory Visit
Admission: RE | Admit: 2019-09-09 | Discharge: 2019-09-09 | Disposition: A | Discharge status: Home / Self Care | Payer: Medicare HMO | Source: Ambulatory Visit | Attending: Vascular Surgery | Admitting: Vascular Surgery

## 2019-09-09 DIAGNOSIS — I6523 Occlusion and stenosis of bilateral carotid arteries: Secondary | ICD-10-CM

## 2019-09-09 MED ORDER — IOPAMIDOL (ISOVUE-370) INJECTION 76%
75.0000 mL | Freq: Once | INTRAVENOUS | Status: AC | PRN
  Administered 2019-09-09: 75 mL via INTRAVENOUS

## 2019-09-10 ENCOUNTER — Encounter: Payer: Self-pay | Admitting: Vascular Surgery

## 2019-09-10 ENCOUNTER — Other Ambulatory Visit: Payer: Self-pay

## 2019-09-10 ENCOUNTER — Ambulatory Visit (INDEPENDENT_AMBULATORY_CARE_PROVIDER_SITE_OTHER): Payer: Medicare HMO | Admitting: Vascular Surgery

## 2019-09-10 VITALS — BP 106/60 | HR 70 | Temp 97.5°F | Resp 18 | Ht 67.0 in | Wt 190.0 lb

## 2019-09-10 DIAGNOSIS — I6523 Occlusion and stenosis of bilateral carotid arteries: Secondary | ICD-10-CM

## 2019-09-10 NOTE — Progress Notes (Signed)
Patient name: Terry Fitzgerald MRN: [id number] DOB: [birth year] Sex: male  REASON FOR CONSULT: F/U for right carotid stenosis after CTA neck  HPI: Terry Fitzgerald is a 84 y.o. male, with history of hypertension, hyperlipidemia, coronary artery disease status post CABG, peripheral arterial disease, tobacco abuse that presents that presents to discuss right carotid stenosis after CTA neck.  He was initially seen several weeks ago after referral from his PCP and felt that his legs feel weak at times.  He had no rest pain or nonhealing wounds otherwise.  He had a history of claudication.  He described a bunch of procedures in [facility] and we had no records of this.  He had no surveillance over the last 6 to 7 years.  He had imaging done at [facility] with ABIs of 0.67 on the right 0.55 on the left with monophasic waveforms at the ankle.  Ultimately we were able to get records from [place] and in review of the records he has had a left common iliac artery angioplasty with stent placement in 2010 for claudication this appears to be an 8 x 20.  He also had a left SFA angioplasty that was done in 2013.  Also an operative note for coronary bypass grafting x3 with left internal mammary to the LAD, saphenous vein graft to the OM and saphenous vein graft to the PDA in 2010.  His daughter was here at last visit and noted that he had bilateral carotid endarterectomies with a subsequent right carotid stent from a transfemoral approach.  On surveillance of the carotids we noted that he had a high-grade stenosis in the right carotid stent that was placed in [place].  I subsequently sent him for a CTA neck to evaluate for TCAR versus transfemoral approach to treat that.  He remains on aspirin Plavix.  No neurologic events since last evaluation.  Past Medical History:  Diagnosis Date  . BPH (benign prostatic hyperplasia)   . Gout, unspecified    history x1  . Hypertension   . Myocardial infarction  [facility]) 2011    Past Surgical History:  Procedure Laterality Date  . APPENDECTOMY    . CATARACT EXTRACTION W/PHACO Right 05/27/2019   Procedure: CATARACT EXTRACTION PHACO AND INTRAOCULAR LENS PLACEMENT (IOC);  Surgeon: Fabio Pierce, MD;  Location: [facility];  Service: Ophthalmology;  Laterality: Right;  CDE: 15.37  . CATARACT EXTRACTION W/PHACO Left 06/14/2019   Procedure: CATARACT EXTRACTION PHACO AND INTRAOCULAR LENS PLACEMENT LEFT EYE;  Surgeon: Fabio Pierce, MD;  Location: [facility];  Service: Ophthalmology;  Laterality: Left;  CDE: 20.79  . CORONARY ARTERY BYPASS GRAFT     triple bypass 2011  . HERNIA REPAIR     Rih  . TONSILLECTOMY      History reviewed. No pertinent family history.  SOCIAL HISTORY: Social History   Socioeconomic History  . Marital status: Widowed    Spouse name: Not on file  . Number of children: Not on file  . Years of education: Not on file  . Highest education level: Not on file  Occupational History  . Not on file  Tobacco Use  . Smoking status: Current Every Day Smoker    Packs/day: 0.50    Years: 65.00    Pack years: 32.50    Types: Cigarettes  . Smokeless tobacco: Never Used  Vaping Use  . Vaping Use: Never used  Substance and Sexual Activity  . Alcohol use: Not Currently  . Drug use: Never  .  Sexual activity: Not Currently  Other Topics Concern  . Not on file  Social History Narrative  . Not on file   Social Determinants of Health   Financial Resource Strain:   . Difficulty of Paying Living Expenses:   Food Insecurity:   . Worried About Programme researcher, broadcasting/film/video in the Last Year:   . Barista in the Last Year:   Transportation Needs:   . Freight forwarder (Medical):   Marland Kitchen Lack of Transportation (Non-Medical):   Physical Activity:   . Days of Exercise per Week:   . Minutes of Exercise per Session:   Stress:   . Feeling of Stress :   Social Connections:   . Frequency of Communication with Friends and Family:   . Frequency of  Social Gatherings with Friends and Family:   . Attends Religious Services:   . Active Member of Clubs or Organizations:   . Attends Banker Meetings:   Marland Kitchen Marital Status:   Intimate Partner Violence:   . Fear of Current or Ex-Partner:   . Emotionally Abused:   Marland Kitchen Physically Abused:   . Sexually Abused:     Allergies  Allergen Reactions  . Drug Class [Morphine And Related] Other (See Comments)    Hallucinations.    Current Outpatient Medications  Medication Sig Dispense Refill  . aspirin 325 MG tablet Take 325 mg by mouth every evening.    . clopidogrel (PLAVIX) 75 MG tablet Take 75 mg by mouth daily.    . finasteride (PROSCAR) 5 MG tablet Take 5 mg by mouth every evening.    . ILEVRO 0.3 % ophthalmic suspension Place 1 drop into the right eye daily.    Marland Kitchen losartan (COZAAR) 100 MG tablet Take 100 mg by mouth daily.    Marland Kitchen lovastatin (MEVACOR) 20 MG tablet Take 20 mg by mouth daily.    Marland Kitchen moxifloxacin (VIGAMOX) 0.5 % ophthalmic solution Apply 1 drop to eye 3 (three) times daily.    . Omega-3 Fatty Acids (FISH OIL) 1000 MG CAPS Take 1,000 capsules by mouth 2 (two) times daily.    . tamsulosin (FLOMAX) 0.4 MG CAPS capsule Take 0.4 mg by mouth daily.    . prednisoLONE acetate (PRED FORTE) 1 % ophthalmic suspension Place 1 drop into the right eye 3 (three) times daily. (Patient not taking: Reported on 09/10/2019)     No current facility-administered medications for this visit.    REVIEW OF SYSTEMS:  [X]  denotes positive finding, [ ]  denotes negative finding Cardiac  Comments:  Chest pain or chest pressure:    Shortness of breath upon exertion:    Short of breath when lying flat:    Irregular heart rhythm:        Vascular    Pain in calf, thigh, or hip brought on by ambulation: x   Pain in feet at night that wakes you up from your sleep:     Blood clot in your veins:    Leg swelling:         Pulmonary    Oxygen at home:    Productive cough:     Wheezing:           Neurologic    Sudden weakness in arms or legs:     Sudden numbness in arms or legs:     Sudden onset of difficulty speaking or slurred speech:    Temporary loss of vision in one eye:     Problems with  dizziness:         Gastrointestinal    Blood in stool:     Vomited blood:         Genitourinary    Burning when urinating:     Blood in urine:        Psychiatric    Major depression:         Hematologic    Bleeding problems:    Problems with blood clotting too easily:        Skin    Rashes or ulcers:        Constitutional    Fever or chills:      PHYSICAL EXAM: Vitals:   09/10/19 1147 09/10/19 1150  BP: (!) 104/60 (!) 106/60  Pulse: 70 70  Resp: 18   Temp: (!) 97.5 F (36.4 C)   TempSrc: Temporal   SpO2: 99%   Weight: 190 lb (86.2 kg)   Height: 5\' 7"  (1.702 m)     GENERAL: The patient is a well-nourished male, in no acute distress. The vital signs are documented above. CARDIAC: There is a regular rate and rhythm.  VASCULAR:  Bilateral neck incisions Palpable femoral pulses bilaterally No palpable popliteal or pedal pulses No lower extremity tissue loss PULMONARY: There is good air exchange bilaterally without wheezing or rales. ABDOMEN: Soft and non-tender with normal pitched bowel sounds.  MUSCULOSKELETAL: There are no major deformities or cyanosis. NEUROLOGIC: No focal weakness or paresthesias are detected.   DATA:   Carotid duplex today shows high-grade stenosis in the proximal right carotid stent with velocities of 428/130 and no significant disease in the left ICA  CTA neck on my review shows a high-grade stenosis in the proximal common carotid artery stent on the right.  On the left he had a previous carotid endarterectomy that is widely patent with no significant stenosis.  Assessment/Plan:  84 year old male initially presented for evaluation of PAD.  On further evaluation became apparent that he had bilateral carotid endarterectomies and when we  did surveillance was noted to have a high-grade stenosis in a right carotid stent that was subsequently placed in 94 after his carotid endarterectomy site got recurrent stenosis.  I sent in for CTA neck and on my review he does have a high-grade stenosis in the proximal portion of the right common carotid stent.  I am hopeful we can schedule for TCAR to treat the stenosis in the right carotid stent.  I am going to review the images with TCAR representative and discussed with patient if not would require transfemoral approach to treat his high-grade stenosis.  This is certainly higher risk and I quoted him a 6 to 7% risk of perioperative stroke.  Discussed other option would be medical management with continued surveillance.  Ultimately he wants intervention.  Discussed that he is going to have to stop smoking given this will now be his third carotid intervention.  He also had an incidental finding of a 12 mm spiculated nodule in the left lung on CT.  He does not have any prior knowledge of this.  Ominous send to pulmonology for further evaluation.  I put told in this plan of care today.  [place], MD [facility] Office: [phone]

## 2019-09-23 ENCOUNTER — Ambulatory Visit (INDEPENDENT_AMBULATORY_CARE_PROVIDER_SITE_OTHER): Payer: Medicare HMO | Admitting: Cardiology

## 2019-09-23 ENCOUNTER — Encounter: Payer: Self-pay | Admitting: *Deleted

## 2019-09-23 ENCOUNTER — Encounter: Payer: Self-pay | Admitting: Cardiology

## 2019-09-23 ENCOUNTER — Other Ambulatory Visit: Payer: Self-pay

## 2019-09-23 VITALS — BP 110/60 | HR 66 | Ht 67.0 in | Wt 183.0 lb

## 2019-09-23 DIAGNOSIS — E782 Mixed hyperlipidemia: Secondary | ICD-10-CM

## 2019-09-23 DIAGNOSIS — I739 Peripheral vascular disease, unspecified: Secondary | ICD-10-CM

## 2019-09-23 DIAGNOSIS — I2510 Atherosclerotic heart disease of native coronary artery without angina pectoris: Secondary | ICD-10-CM

## 2019-09-23 NOTE — Patient Instructions (Signed)

## 2019-09-23 NOTE — Progress Notes (Signed)
Clinical Summary Mr. Loveland is a 84 y.o.male seen today as a new patient for the following medical problems.    1. CAD - history of prior CABG - cath 12/2010": mild diffuse LM disease, occluded LCX, occluded RCA.  Patent LIMA-LAD patent, patent SVG-OM, patent SVG-PDA,  - he report some stents after bypass 8-10 years ago  - no recent chest pain. No SOB/DOE - compliant with meds. - has been on ASA 325 and plavix for long time.  - uses push mower x 1 hour without troubles, including uphill.    2. PAD - prior left common iliac stenting. Prior left SFA angioplasty.  - followed by vascular   3. Carotid stenosis - previous bilateral CEAs as well as prior stenting - followed by vascular, evidence of high grade recurrent stenosis RICA considerint repeat intervention  4. Hyperlipidemia - he is unsure if has ever been on there statins, has been on lovastain.  - labs followed by pcp   Past Medical History:  Diagnosis Date  . BPH (benign prostatic hyperplasia)   . Gout, unspecified    history x1  . Hypertension   . Myocardial infarction [facility]) 2011     Allergies  Allergen Reactions  . Drug Class [Morphine And Related] Other (See Comments)    Hallucinations.     Current Outpatient Medications  Medication Sig Dispense Refill  . aspirin 325 MG tablet Take 325 mg by mouth every evening.    . clopidogrel (PLAVIX) 75 MG tablet Take 75 mg by mouth daily.    . finasteride (PROSCAR) 5 MG tablet Take 5 mg by mouth every evening.    . ILEVRO 0.3 % ophthalmic suspension Place 1 drop into the right eye daily.    Marland Kitchen losartan (COZAAR) 100 MG tablet Take 100 mg by mouth daily.    Marland Kitchen lovastatin (MEVACOR) 20 MG tablet Take 20 mg by mouth daily.    Marland Kitchen moxifloxacin (VIGAMOX) 0.5 % ophthalmic solution Apply 1 drop to eye 3 (three) times daily.    . Omega-3 Fatty Acids (FISH OIL) 1000 MG CAPS Take 1,000 capsules by mouth 2 (two) times daily.    . prednisoLONE acetate (PRED FORTE) 1 %  ophthalmic suspension Place 1 drop into the right eye 3 (three) times daily. (Patient not taking: Reported on 09/10/2019)    . tamsulosin (FLOMAX) 0.4 MG CAPS capsule Take 0.4 mg by mouth daily.     No current facility-administered medications for this visit.     Past Surgical History:  Procedure Laterality Date  . APPENDECTOMY    . CATARACT EXTRACTION W/PHACO Right 05/27/2019   Procedure: CATARACT EXTRACTION PHACO AND INTRAOCULAR LENS PLACEMENT (IOC);  Surgeon: Fabio Pierce, MD;  Location: [facility];  Service: Ophthalmology;  Laterality: Right;  CDE: 15.37  . CATARACT EXTRACTION W/PHACO Left 06/14/2019   Procedure: CATARACT EXTRACTION PHACO AND INTRAOCULAR LENS PLACEMENT LEFT EYE;  Surgeon: Fabio Pierce, MD;  Location: [facility];  Service: Ophthalmology;  Laterality: Left;  CDE: 20.79  . CORONARY ARTERY BYPASS GRAFT     triple bypass 2011  . HERNIA REPAIR     Rih  . TONSILLECTOMY       Allergies  Allergen Reactions  . Drug Class [Morphine And Related] Other (See Comments)    Hallucinations.      No family history on file.   Social History Mr. Sheeler reports that he has been smoking cigarettes. He has a 32.50 pack-year smoking history. He has never used smokeless tobacco. Mr.  Hensch reports previous alcohol use.   Review of Systems CONSTITUTIONAL: No weight loss, fever, chills, weakness or fatigue.  HEENT: Eyes: No visual loss, blurred vision, double vision or yellow sclerae.No hearing loss, sneezing, congestion, runny nose or sore throat.  SKIN: No rash or itching.  CARDIOVASCULAR: per hpi RESPIRATORY: No shortness of breath, cough or sputum.  GASTROINTESTINAL: No anorexia, nausea, vomiting or diarrhea. No abdominal pain or blood.  GENITOURINARY: No burning on urination, no polyuria NEUROLOGICAL: No headache, dizziness, syncope, paralysis, ataxia, numbness or tingling in the extremities. No change in bowel or bladder control.  MUSCULOSKELETAL: No muscle, back pain, joint  pain or stiffness.  LYMPHATICS: No enlarged nodes. No history of splenectomy.  PSYCHIATRIC: No history of depression or anxiety.  ENDOCRINOLOGIC: No reports of sweating, cold or heat intolerance. No polyuria or polydipsia.  Marland Kitchen   Physical Examination Today's Vitals   09/23/19 0906  BP: 110/60  Pulse: 66  SpO2: 99%  Weight: 183 lb (83 kg)  Height: 5\' 7"  (1.702 m)   Body mass index is 28.66 kg/m.  Gen: resting comfortably, no acute distress HEENT: no scleral icterus, pupils equal round and reactive, no palptable cervical adenopathy,  CV: RRR, no m/r/g, no jvd. +bialtarl carotid bruits Resp: Clear to auscultation bilaterally         GI: abdomen is soft, non-tender, non-distended, normal bowel sounds, no hepatosplenomegaly MSK: extremities are warm, no edema.  Skin: warm, no rash Neuro:  no focal deficits Psych: appropriate affect      Assessment and Plan   1. CAD - no recent symptoms - high dose ASA and plavix more for his extesnive PAD history as opposed for his CAD, reasonable to continue - continue current meds - EKG shows SR, no acute ischemic changes - tolerates greater than 4 METs without symptoms  2. Hyperlipidemia - fairly mild statin, he is unsure if has been on more potent statins in the past - request labs from pcp, pending numbers could consider more potent statin, LDL goal would be <70  3. PAD - continue medical therapy - continue to follow with vascular - no contraindication from cardaic stanpdoing for upcoming carotid intervention   , M.D.

## 2019-10-07 ENCOUNTER — Other Ambulatory Visit: Payer: Self-pay | Admitting: *Deleted

## 2019-10-08 ENCOUNTER — Other Ambulatory Visit: Payer: Self-pay

## 2019-10-23 ENCOUNTER — Encounter (HOSPITAL_COMMUNITY)
Admission: RE | Admit: 2019-10-23 | Discharge: 2019-10-23 | Disposition: A | Discharge status: Home / Self Care | Payer: Medicare HMO | Source: Ambulatory Visit

## 2019-10-23 ENCOUNTER — Encounter (HOSPITAL_COMMUNITY): Payer: Self-pay

## 2019-10-23 ENCOUNTER — Other Ambulatory Visit: Payer: Self-pay

## 2019-10-23 ENCOUNTER — Other Ambulatory Visit (HOSPITAL_COMMUNITY)
Admission: RE | Admit: 2019-10-23 | Discharge: 2019-10-23 | Disposition: A | Discharge status: Home / Self Care | Payer: Medicare HMO | Source: Ambulatory Visit | Attending: Vascular Surgery | Admitting: Vascular Surgery

## 2019-10-23 DIAGNOSIS — Z01818 Encounter for other preprocedural examination: Secondary | ICD-10-CM | POA: Insufficient documentation

## 2019-10-23 DIAGNOSIS — Z20822 Contact with and (suspected) exposure to covid-19: Secondary | ICD-10-CM | POA: Insufficient documentation

## 2019-10-23 LAB — PROTIME-INR
INR: 0.9 (ref 0.8–1.2)
Prothrombin Time: 11.6 seconds (ref 11.4–15.2)

## 2019-10-23 LAB — COMPREHENSIVE METABOLIC PANEL
ALT: 11 U/L (ref 0–44)
AST: 16 U/L (ref 15–41)
Albumin: 3.9 g/dL (ref 3.5–5.0)
Alkaline Phosphatase: 66 U/L (ref 38–126)
Anion gap: 11 (ref 5–15)
BUN: 17 mg/dL (ref 8–23)
CO2: 21 mmol/L — ABNORMAL LOW (ref 22–32)
Calcium: 9.7 mg/dL (ref 8.9–10.3)
Chloride: 101 mmol/L (ref 98–111)
Creatinine, Ser: 1.16 mg/dL (ref 0.61–1.24)
GFR calc Af Amer: >60 mL/min (ref >60)
GFR calc non Af Amer: 59 mL/min — ABNORMAL LOW (ref >60)
Glucose, Bld: 87 mg/dL (ref 70–99)
Potassium: 5 mmol/L (ref 3.5–5.1)
Sodium: 133 mmol/L — ABNORMAL LOW (ref 135–145)
Total Bilirubin: 0.3 mg/dL (ref 0.3–1.2)
Total Protein: 6.9 g/dL (ref 6.5–8.1)

## 2019-10-23 LAB — APTT: aPTT: 33 seconds (ref 24–36)

## 2019-10-23 LAB — CBC
HCT: 41.4 % (ref 39.0–52.0)
Hemoglobin: 12.6 g/dL — ABNORMAL LOW (ref 13.0–17.0)
MCH: 27.4 pg (ref 26.0–34.0)
MCHC: 30.4 g/dL (ref 30.0–36.0)
MCV: 90 fL (ref 80.0–100.0)
Platelets: 222 10*3/uL (ref 150–400)
RBC: 4.6 MIL/uL (ref 4.22–5.81)
RDW: 12.8 % (ref 11.5–15.5)
WBC: 8.7 10*3/uL (ref 4.0–10.5)
nRBC: 0 % (ref 0.0–0.2)

## 2019-10-23 LAB — URINALYSIS, ROUTINE W REFLEX MICROSCOPIC
Bacteria, UA: NONE SEEN
Bilirubin Urine: NEGATIVE
Glucose, UA: NEGATIVE mg/dL
Hgb urine dipstick: NEGATIVE
Ketones, ur: NEGATIVE mg/dL
Nitrite: NEGATIVE
Protein, ur: NEGATIVE mg/dL
Specific Gravity, Urine: 1.005 (ref 1.005–1.030)
pH: 6 (ref 5.0–8.0)

## 2019-10-23 LAB — TYPE AND SCREEN
ABO/RH(D): A POS
Antibody Screen: NEGATIVE

## 2019-10-23 LAB — SURGICAL PCR SCREEN
MRSA, PCR: NEGATIVE
Staphylococcus aureus: NEGATIVE

## 2019-10-23 LAB — SARS CORONAVIRUS 2 (TAT 6-24 HRS): SARS Coronavirus 2: NEGATIVE

## 2019-10-23 NOTE — Progress Notes (Addendum)
PCP - Dr. Perlie Mayo Cardiologist - Dr. Wyline Mood in [facility]  Chest x-ray - Not indicated EKG - 09/23/19 Stress Test - Long time ago per patient ECHO - Unsure Cardiac Cath - Had CABG  Sleep Study - No OSA DM - Denies  Blood Thinner Instructions: Instructed patient to call Dr. Chestine Spore for instruction on Plavix Aspirin Instructions: Instructed patient to call Dr. Chestine Spore for instruction  COVID TEST- Sept 8th   Anesthesia review: Yes Cardiac history  Patient denies shortness of breath, fever, cough and chest pain at PAT appointment   All instructions explained to the patient, with a verbal understanding of the material. Patient agrees to go over the instructions while at home for a better understanding. Patient also instructed to self quarantine after being tested for COVID-19. The opportunity to ask questions was provided.  Patient thought appt time was 12pm.  Called him and did interview done over phone.  Patient coming in to get labs done.

## 2019-10-23 NOTE — Pre-Procedure Instructions (Signed)
[facility] [address] [place] - [address] [place] [address] Phone: [phone] Fax: [phone]      Your procedure is scheduled on Friday September 10th.  Report to [facility] Main Entrance "A" at 5:30 A.M., and check in at the Admitting office.  Call this number if you have problems the morning of surgery:  [phone]  Call [phone] if you have any questions prior to your surgery date Monday-Friday 8am-4pm    Remember:  Do not eat or drink after midnight the night before your surgery    Take these medicines the morning of surgery with A SIP OF WATER   lovastatin (MEVACOR) 20 MG tablet  tamsulosin (FLOMAX) 0.4 MG CAPS capsule   As of today, STOP taking any Aspirin (unless otherwise instructed by your surgeon) Aleve, Naproxen, Ibuprofen, Motrin, Advil, Goody's, BC's, all herbal medications, fish oil, and all vitamins.                      Do not wear jewelry            Do not wear lotions, powders, colognes, or deodorant.            Do not shave 48 hours prior to surgery.  Men may shave face and neck.            Do not bring valuables to the hospital.            [facility] is not responsible for any belongings or valuables.  Do NOT Smoke (Tobacco/Vaping) or drink Alcohol 24 hours prior to your procedure If you use a CPAP at night, you may bring all equipment for your overnight stay.   Contacts, glasses, dentures or bridgework may not be worn into surgery.      For patients admitted to the hospital, discharge time will be determined by your treatment team.   Patients discharged the day of surgery will not be allowed to drive home, and someone needs to stay with them for 24 hours.    Special instructions:   [place]- Preparing For Surgery  Before surgery, you can play an important role. Because skin is not sterile, your skin needs to be as free of germs as possible. You can reduce the number of germs on your skin by washing with CHG  (chlorahexidine gluconate) Soap before surgery.  CHG is an antiseptic cleaner which kills germs and bonds with the skin to continue killing germs even after washing.    Oral Hygiene is also important to reduce your risk of infection.  Remember - BRUSH YOUR TEETH THE MORNING OF SURGERY WITH YOUR REGULAR TOOTHPASTE  Please do not use if you have an allergy to CHG or antibacterial soaps. If your skin becomes reddened/irritated stop using the CHG.  Do not shave (including legs and underarms) for at least 48 hours prior to first CHG shower. It is OK to shave your face.  Please follow these instructions carefully.   1. Shower the NIGHT BEFORE SURGERY and the MORNING OF SURGERY with CHG Soap.   2. If you chose to wash your hair, wash your hair first as usual with your normal shampoo.  3. After you shampoo, rinse your hair and body thoroughly to remove the shampoo.  4. Use CHG as you would any other liquid soap. You can apply CHG directly to the skin and wash gently with a scrungie or a clean washcloth.   5. Apply  the CHG Soap to your body ONLY FROM THE NECK DOWN.  Do not use on open wounds or open sores. Avoid contact with your eyes, ears, mouth and genitals (private parts). Wash Face and genitals (private parts)  with your normal soap.   6. Wash thoroughly, paying special attention to the area where your surgery will be performed.  7. Thoroughly rinse your body with warm water from the neck down.  8. DO NOT shower/wash with your normal soap after using and rinsing off the CHG Soap.  9. Pat yourself dry with a CLEAN TOWEL.  10. Wear CLEAN PAJAMAS to bed the night before surgery  11. Place CLEAN SHEETS on your bed the night of your first shower and DO NOT SLEEP WITH PETS.   Day of Surgery: Wear Clean/Comfortable clothing the morning of surgery Do not apply any deodorants/lotions.   Remember to brush your teeth WITH YOUR REGULAR TOOTHPASTE.   Please read over the following fact sheets  that you were given.

## 2019-10-24 NOTE — Progress Notes (Signed)
Anesthesia Chart Review:  Follows with cardiology for history of CAD s/p CABGx3 in 2010.  Per cardiology notes, cath 12/2010 showed mild diffuse LM disease, occluded LCX, occluded RCA. Patent LIMA-LAD patent, patent SVG-OM, patent SVG-PDA.  Last seen by Dr. Wyline Mood 09/23/2019 and discussed upcoming surgery.  Per note, patient has had no recent symptoms and tolerates greater than 4 METS without symptoms.  EKG shows sinus rhythm with no acute ischemic changes.  No contraindication from cardiac standpoint for upcoming carotid intervention.  Follows with vascular surgery for history of PAD with prior left common iliac stenting and prior left SFA angioplasty.  He has also had previous bilateral CEAs as well as prior right carotid stenting.  Patient also had incidental finding of 12 mm spiculated nodule in left lung on CT.  Vascular surgery has referred to pulmonology for further evaluation.  He is a current everyday smoker.  Preop labs reviewed, unremarkable.  Recent spirometry performed at [facility] 06/21/19. Results: The FVC, FEV1, FEV1/FVC ratio and FEF25-75% are within normal limits. While the vital capacity and total lung capacity are within normal limits, the RV/TLC ratio is increased. Following administration of bronchodilators, there is a significant response indicated by the increased FVC. The reduced diffusing capacity indicates a moderate loss of functional alveolar capillary surface. However, the diffusing capacity was not corrected for the patient's hemoglobin.  Pulmonary Function Diagnosis: possible mild asthma Moderate Diffusion Defect  EKG 10/24/2019: Sinus rhythm with first-degree AV block.  Rate 66.   Zannie Cove [facility] Short Stay Center/Anesthesiology Phone [phone] 10/24/2019 10:46 AM

## 2019-10-24 NOTE — Anesthesia Preprocedure Evaluation (Addendum)
Anesthesia Evaluation  Patient identified by MRN, date of birth, ID band Patient awake    Reviewed: Allergy & Precautions, NPO status , Patient's Chart, lab work & pertinent test results  History of Anesthesia Complications Negative for: history of anesthetic complications  Airway Mallampati: II  TM Distance: >3 FB Neck ROM: Full    Dental  (+) Edentulous Upper, Edentulous Lower   Pulmonary neg sleep apnea, neg recent URI, Current Smoker and Patient abstained from smoking.,  Covid-19 Nucleic Acid Test Results Lab Results      Component                Value               Date                      SARSCOV2NAA              NEGATIVE            10/23/2019                SARSCOV2NAA              NEGATIVE            06/12/2019                SARSCOV2NAA              Not Detected        06/06/2019                SARSCOV2NAA              NEGATIVE            05/23/2019              breath sounds clear to auscultation       Cardiovascular hypertension, Pt. on medications + Past MI, + CABG and + Peripheral Vascular Disease   Rhythm:Regular     Neuro/Psych negative neurological ROS  negative psych ROS   GI/Hepatic negative GI ROS, Neg liver ROS,   Endo/Other  negative endocrine ROS  Renal/GU negative Renal ROSLab Results      Component                Value               Date                      CREATININE               1.16                10/23/2019           Lab Results      Component                Value               Date                      K                        5.0                 10/23/2019                Musculoskeletal   Abdominal   Peds  Hematology negative  hematology ROS (+)   Anesthesia Other Findings Follows with cardiology for history of CAD s/p CABGx3 in 2010.  Per cardiology notes, cath 12/2010 showed mild diffuse LM disease, occluded LCX, occluded RCA. Patent LIMA-LAD patent, patent SVG-OM, patent  SVG-PDA.  Last seen by Dr. Wyline Mood 09/23/2019 and discussed upcoming surgery.  Per note, patient has had no recent symptoms and tolerates greater than 4 METS without symptoms.  EKG shows sinus rhythm with no acute ischemic changes.  No contraindication from cardiac standpoint for upcoming carotid intervention.  Follows with vascular surgery for history of PAD with prior left common iliac stenting and prior left SFA angioplasty.  He has also had previous bilateral CEAs as well as prior right carotid stenting.  Patient also had incidental finding of 12 mm spiculated nodule in left lung on CT.  Vascular surgery has referred to pulmonology for further evaluation.  He is a current everyday smoker.  Preop labs reviewed, unremarkable.  Recent spirometry performed at [facility] 06/21/19. Results: The FVC, FEV1, FEV1/FVC ratio and FEF25-75% are within normal limits. While the vital capacity and total lung capacity are within normal limits, the RV/TLC ratio is increased. Following administration of bronchodilators, there is a significant response indicated by the increased FVC. The reduced diffusing capacity indicates a moderate loss of functional alveolar capillary surface. However, the diffusing capacity was not corrected for the patient's hemoglobin.  Reproductive/Obstetrics                           Anesthesia Physical Anesthesia Plan  ASA: III  Anesthesia Plan: General   Post-op Pain Management:    Induction: Intravenous  PONV Risk Score and Plan: 2 and Ondansetron, Dexamethasone and Midazolam  Airway Management Planned: Oral ETT  Additional Equipment: Arterial line  Intra-op Plan:   Post-operative Plan: Extubation in OR  Informed Consent: I have reviewed the patients History and Physical, chart, labs and discussed the procedure including the risks, benefits and alternatives for the proposed anesthesia with the patient or authorized representative who has indicated  his/her understanding and acceptance.     Dental advisory given  Plan Discussed with: CRNA and Anesthesiologist  Anesthesia Plan Comments: (PAT note by Antionette Poles, PA-C: Follows with cardiology for history of CAD s/p CABGx3 in 2010.  Per cardiology notes, cath 12/2010 showed mild diffuse LM disease, occluded LCX, occluded RCA. Patent LIMA-LAD patent, patent SVG-OM, patent SVG-PDA.  Last seen by Dr. Wyline Mood 09/23/2019 and discussed upcoming surgery.  Per note, patient has had no recent symptoms and tolerates greater than 4 METS without symptoms.  EKG shows sinus rhythm with no acute ischemic changes.  No contraindication from cardiac standpoint for upcoming carotid intervention.  Follows with vascular surgery for history of PAD with prior left common iliac stenting and prior left SFA angioplasty.  He has also had previous bilateral CEAs as well as prior right carotid stenting.  Patient also had incidental finding of 12 mm spiculated nodule in left lung on CT.  Vascular surgery has referred to pulmonology for further evaluation.  He is a current everyday smoker.  Preop labs reviewed, unremarkable.  Recent spirometry performed at [facility] 06/21/19. Results: The FVC, FEV1, FEV1/FVC ratio and FEF25-75% are within normal limits. While the vital capacity and total lung capacity are within normal limits, the RV/TLC ratio is increased. Following administration of bronchodilators, there is a significant response indicated by the increased FVC. The reduced diffusing capacity indicates a moderate loss of functional  alveolar capillary surface. However, the diffusing capacity was not corrected for the patient's hemoglobin.  Pulmonary Function Diagnosis: possible mild asthma Moderate Diffusion Defect  EKG 10/24/2019: Sinus rhythm with first-degree AV block.  Rate 66.  )     Anesthesia Quick Evaluation

## 2019-10-25 ENCOUNTER — Inpatient Hospital Stay (HOSPITAL_COMMUNITY)
Admission: RE | Admit: 2019-10-25 | Discharge: 2019-10-26 | DRG: 254 | Disposition: A | Discharge status: Home / Self Care | Payer: Medicare HMO | Attending: Vascular Surgery | Admitting: Vascular Surgery

## 2019-10-25 ENCOUNTER — Inpatient Hospital Stay (HOSPITAL_COMMUNITY): Payer: Medicare HMO

## 2019-10-25 ENCOUNTER — Encounter (HOSPITAL_COMMUNITY): Payer: Self-pay | Admitting: Vascular Surgery

## 2019-10-25 ENCOUNTER — Other Ambulatory Visit: Payer: Self-pay

## 2019-10-25 ENCOUNTER — Encounter (HOSPITAL_COMMUNITY)
Admission: RE | Disposition: A | Discharge status: Home / Self Care | Payer: Self-pay | Source: Home / Self Care | Attending: Vascular Surgery

## 2019-10-25 ENCOUNTER — Inpatient Hospital Stay (HOSPITAL_COMMUNITY): Payer: Medicare HMO | Admitting: Physician Assistant

## 2019-10-25 ENCOUNTER — Inpatient Hospital Stay (HOSPITAL_COMMUNITY): Payer: Medicare HMO | Admitting: Certified Registered Nurse Anesthetist

## 2019-10-25 DIAGNOSIS — N400 Enlarged prostate without lower urinary tract symptoms: Secondary | ICD-10-CM | POA: Diagnosis present

## 2019-10-25 DIAGNOSIS — I252 Old myocardial infarction: Secondary | ICD-10-CM | POA: Diagnosis not present

## 2019-10-25 DIAGNOSIS — Z951 Presence of aortocoronary bypass graft: Secondary | ICD-10-CM | POA: Diagnosis not present

## 2019-10-25 DIAGNOSIS — I6523 Occlusion and stenosis of bilateral carotid arteries: Secondary | ICD-10-CM | POA: Diagnosis not present

## 2019-10-25 DIAGNOSIS — I70202 Unspecified atherosclerosis of native arteries of extremities, left leg: Secondary | ICD-10-CM | POA: Diagnosis present

## 2019-10-25 DIAGNOSIS — Z79899 Other long term (current) drug therapy: Secondary | ICD-10-CM

## 2019-10-25 DIAGNOSIS — Z20822 Contact with and (suspected) exposure to covid-19: Secondary | ICD-10-CM | POA: Diagnosis not present

## 2019-10-25 DIAGNOSIS — T82856A Stenosis of peripheral vascular stent, initial encounter: Secondary | ICD-10-CM | POA: Diagnosis not present

## 2019-10-25 DIAGNOSIS — Z95820 Peripheral vascular angioplasty status with implants and grafts: Secondary | ICD-10-CM

## 2019-10-25 DIAGNOSIS — E785 Hyperlipidemia, unspecified: Secondary | ICD-10-CM | POA: Diagnosis present

## 2019-10-25 DIAGNOSIS — I10 Essential (primary) hypertension: Secondary | ICD-10-CM | POA: Diagnosis present

## 2019-10-25 DIAGNOSIS — I739 Peripheral vascular disease, unspecified: Secondary | ICD-10-CM | POA: Diagnosis not present

## 2019-10-25 DIAGNOSIS — Z7982 Long term (current) use of aspirin: Secondary | ICD-10-CM | POA: Diagnosis not present

## 2019-10-25 DIAGNOSIS — Z7902 Long term (current) use of antithrombotics/antiplatelets: Secondary | ICD-10-CM

## 2019-10-25 DIAGNOSIS — M109 Gout, unspecified: Secondary | ICD-10-CM | POA: Diagnosis present

## 2019-10-25 DIAGNOSIS — Z885 Allergy status to narcotic agent status: Secondary | ICD-10-CM

## 2019-10-25 DIAGNOSIS — Y712 Prosthetic and other implants, materials and accessory cardiovascular devices associated with adverse incidents: Secondary | ICD-10-CM | POA: Diagnosis not present

## 2019-10-25 DIAGNOSIS — R69 Illness, unspecified: Secondary | ICD-10-CM | POA: Diagnosis not present

## 2019-10-25 DIAGNOSIS — R911 Solitary pulmonary nodule: Secondary | ICD-10-CM | POA: Diagnosis present

## 2019-10-25 DIAGNOSIS — F17210 Nicotine dependence, cigarettes, uncomplicated: Secondary | ICD-10-CM | POA: Diagnosis present

## 2019-10-25 DIAGNOSIS — I2510 Atherosclerotic heart disease of native coronary artery without angina pectoris: Secondary | ICD-10-CM | POA: Diagnosis present

## 2019-10-25 DIAGNOSIS — I6521 Occlusion and stenosis of right carotid artery: Secondary | ICD-10-CM | POA: Diagnosis not present

## 2019-10-25 DIAGNOSIS — I6529 Occlusion and stenosis of unspecified carotid artery: Secondary | ICD-10-CM | POA: Diagnosis present

## 2019-10-25 HISTORY — PX: ULTRASOUND GUIDANCE FOR VASCULAR ACCESS: SHX6516

## 2019-10-25 HISTORY — PX: TRANSCAROTID ARTERY REVASCULARIZATIONÂ: SHX6778

## 2019-10-25 LAB — ABO/RH: ABO/RH(D): A POS

## 2019-10-25 LAB — POCT ACTIVATED CLOTTING TIME
Activated Clotting Time: 175 seconds
Activated Clotting Time: 279 seconds

## 2019-10-25 SURGERY — TRANSCAROTID ARTERY REVASCULARIZATION (TCAR)
Anesthesia: General | Site: Neck | Laterality: Right

## 2019-10-25 MED ORDER — PRAVASTATIN SODIUM 10 MG PO TABS: 20.0000 mg | ORAL_TABLET | Freq: Every day | ORAL | Status: DC

## 2019-10-25 MED ORDER — DOCUSATE SODIUM 100 MG PO CAPS
100.0000 mg | ORAL_CAPSULE | Freq: Every day | ORAL | Status: DC
  Administered 2019-10-26: 100 mg via ORAL
  Filled 2019-10-25: qty 1

## 2019-10-25 MED ORDER — ACETAMINOPHEN 325 MG RE SUPP
325.0000 mg | RECTAL | Status: DC | PRN
  Filled 2019-10-25: qty 2

## 2019-10-25 MED ORDER — SODIUM CHLORIDE 0.9 % IV SOLN
INTRAVENOUS | Status: AC
  Filled 2019-10-25: qty 1.2

## 2019-10-25 MED ORDER — ORAL CARE MOUTH RINSE: 15.0000 mL | Freq: Once | OROMUCOSAL | Status: AC

## 2019-10-25 MED ORDER — PHENOL 1.4 % MT LIQD: 1.0000 | OROMUCOSAL | Status: DC | PRN

## 2019-10-25 MED ORDER — MAGNESIUM SULFATE 2 GM/50ML IV SOLN: 2.0000 g | Freq: Every day | INTRAVENOUS | Status: DC | PRN

## 2019-10-25 MED ORDER — PROTAMINE SULFATE 10 MG/ML IV SOLN
INTRAVENOUS | Status: AC
  Filled 2019-10-25: qty 5

## 2019-10-25 MED ORDER — FENTANYL CITRATE (PF) 250 MCG/5ML IJ SOLN
INTRAMUSCULAR | Status: AC
  Filled 2019-10-25: qty 5

## 2019-10-25 MED ORDER — POTASSIUM CHLORIDE CRYS ER 20 MEQ PO TBCR: 20.0000 meq | EXTENDED_RELEASE_TABLET | Freq: Every day | ORAL | Status: DC | PRN

## 2019-10-25 MED ORDER — METOPROLOL TARTRATE 5 MG/5ML IV SOLN: 2.0000 mg | INTRAVENOUS | Status: DC | PRN

## 2019-10-25 MED ORDER — LABETALOL HCL 5 MG/ML IV SOLN: 10.0000 mg | INTRAVENOUS | Status: DC | PRN

## 2019-10-25 MED ORDER — GLYCOPYRROLATE PF 0.2 MG/ML IJ SOSY
PREFILLED_SYRINGE | INTRAMUSCULAR | Status: AC
  Filled 2019-10-25: qty 1

## 2019-10-25 MED ORDER — DEXAMETHASONE SODIUM PHOSPHATE 10 MG/ML IJ SOLN
INTRAMUSCULAR | Status: AC
  Filled 2019-10-25: qty 1

## 2019-10-25 MED ORDER — LIDOCAINE 2% (20 MG/ML) 5 ML SYRINGE
INTRAMUSCULAR | Status: DC | PRN
  Administered 2019-10-25: 80 mg via INTRAVENOUS

## 2019-10-25 MED ORDER — PHENYLEPHRINE HCL (PRESSORS) 10 MG/ML IV SOLN
INTRAVENOUS | Status: DC | PRN
  Administered 2019-10-25: 80 ug via INTRAVENOUS
  Administered 2019-10-25: 40 ug via INTRAVENOUS

## 2019-10-25 MED ORDER — HEMOSTATIC AGENTS (NO CHARGE) OPTIME
TOPICAL | Status: DC | PRN
  Administered 2019-10-25: 1 via TOPICAL

## 2019-10-25 MED ORDER — CEFAZOLIN SODIUM-DEXTROSE 2-4 GM/100ML-% IV SOLN
2.0000 g | Freq: Three times a day (TID) | INTRAVENOUS | Status: AC
  Administered 2019-10-25 – 2019-10-26 (×2): 2 g via INTRAVENOUS
  Filled 2019-10-25 (×2): qty 100

## 2019-10-25 MED ORDER — FENTANYL CITRATE (PF) 100 MCG/2ML IJ SOLN
INTRAMUSCULAR | Status: DC | PRN
  Administered 2019-10-25 (×3): 50 ug via INTRAVENOUS

## 2019-10-25 MED ORDER — CLOPIDOGREL BISULFATE 75 MG PO TABS
75.0000 mg | ORAL_TABLET | Freq: Every day | ORAL | Status: DC
  Administered 2019-10-26: 75 mg via ORAL
  Filled 2019-10-25: qty 1

## 2019-10-25 MED ORDER — FENTANYL CITRATE (PF) 100 MCG/2ML IJ SOLN: 25.0000 ug | INTRAMUSCULAR | Status: DC | PRN

## 2019-10-25 MED ORDER — SODIUM CHLORIDE 0.9 % IV SOLN: INTRAVENOUS | Status: DC

## 2019-10-25 MED ORDER — GUAIFENESIN-DM 100-10 MG/5ML PO SYRP: 15.0000 mL | ORAL_SOLUTION | ORAL | Status: DC | PRN

## 2019-10-25 MED ORDER — ASPIRIN 81 MG PO CHEW
CHEWABLE_TABLET | ORAL | Status: AC
  Administered 2019-10-25: 81 mg via ORAL
  Filled 2019-10-25: qty 1

## 2019-10-25 MED ORDER — ASPIRIN 325 MG PO TABS
325.0000 mg | ORAL_TABLET | Freq: Every evening | ORAL | Status: DC
  Administered 2019-10-25: 325 mg via ORAL
  Filled 2019-10-25: qty 1

## 2019-10-25 MED ORDER — ONDANSETRON HCL 4 MG/2ML IJ SOLN: 4.0000 mg | Freq: Four times a day (QID) | INTRAMUSCULAR | Status: DC | PRN

## 2019-10-25 MED ORDER — SENNOSIDES-DOCUSATE SODIUM 8.6-50 MG PO TABS: 1.0000 | ORAL_TABLET | Freq: Every evening | ORAL | Status: DC | PRN

## 2019-10-25 MED ORDER — HEPARIN SODIUM (PORCINE) 1000 UNIT/ML IJ SOLN
INTRAMUSCULAR | Status: DC | PRN
  Administered 2019-10-25: 9000 [IU] via INTRAVENOUS
  Administered 2019-10-25: 3000 [IU] via INTRAVENOUS

## 2019-10-25 MED ORDER — ALUM & MAG HYDROXIDE-SIMETH 200-200-20 MG/5ML PO SUSP: 15.0000 mL | ORAL | Status: DC | PRN

## 2019-10-25 MED ORDER — LACTATED RINGERS IV SOLN: INTRAVENOUS | Status: DC

## 2019-10-25 MED ORDER — HEPARIN SODIUM (PORCINE) 1000 UNIT/ML IJ SOLN
INTRAMUSCULAR | Status: AC
  Filled 2019-10-25: qty 2

## 2019-10-25 MED ORDER — HEPARIN SODIUM (PORCINE) 5000 UNIT/ML IJ SOLN: 5000.0000 [IU] | Freq: Three times a day (TID) | INTRAMUSCULAR | Status: DC

## 2019-10-25 MED ORDER — CHLORHEXIDINE GLUCONATE 0.12 % MT SOLN
OROMUCOSAL | Status: AC
  Administered 2019-10-25: 15 mL via OROMUCOSAL
  Filled 2019-10-25: qty 15

## 2019-10-25 MED ORDER — ASPIRIN 81 MG PO CHEW
81.0000 mg | CHEWABLE_TABLET | Freq: Once | ORAL | Status: AC
  Filled 2019-10-25: qty 1

## 2019-10-25 MED ORDER — CHLORHEXIDINE GLUCONATE CLOTH 2 % EX PADS: 6.0000 | MEDICATED_PAD | Freq: Once | CUTANEOUS | Status: DC

## 2019-10-25 MED ORDER — HYDRALAZINE HCL 20 MG/ML IJ SOLN: 5.0000 mg | INTRAMUSCULAR | Status: DC | PRN

## 2019-10-25 MED ORDER — ONDANSETRON HCL 4 MG/2ML IJ SOLN
INTRAMUSCULAR | Status: AC
  Filled 2019-10-25: qty 2

## 2019-10-25 MED ORDER — ROCURONIUM BROMIDE 10 MG/ML (PF) SYRINGE
PREFILLED_SYRINGE | INTRAVENOUS | Status: AC
  Filled 2019-10-25: qty 10

## 2019-10-25 MED ORDER — IODIXANOL 320 MG/ML IV SOLN
INTRAVENOUS | Status: DC | PRN
  Administered 2019-10-25: 25 mL

## 2019-10-25 MED ORDER — TRAMADOL HCL 50 MG PO TABS: 50.0000 mg | ORAL_TABLET | Freq: Four times a day (QID) | ORAL | Status: DC | PRN

## 2019-10-25 MED ORDER — ASPIRIN EC 325 MG PO TBEC: 325.0000 mg | DELAYED_RELEASE_TABLET | Freq: Every day | ORAL | Status: DC

## 2019-10-25 MED ORDER — ONDANSETRON HCL 4 MG/2ML IJ SOLN
INTRAMUSCULAR | Status: DC | PRN
  Administered 2019-10-25: 4 mg via INTRAVENOUS

## 2019-10-25 MED ORDER — CEFAZOLIN SODIUM-DEXTROSE 2-4 GM/100ML-% IV SOLN
2.0000 g | INTRAVENOUS | Status: AC
  Administered 2019-10-25: 2 g via INTRAVENOUS

## 2019-10-25 MED ORDER — SUGAMMADEX SODIUM 200 MG/2ML IV SOLN
INTRAVENOUS | Status: DC | PRN
  Administered 2019-10-25: 200 mg via INTRAVENOUS

## 2019-10-25 MED ORDER — PROPOFOL 10 MG/ML IV BOLUS
INTRAVENOUS | Status: AC
  Filled 2019-10-25: qty 20

## 2019-10-25 MED ORDER — CLOPIDOGREL BISULFATE 75 MG PO TABS: 75.0000 mg | ORAL_TABLET | Freq: Every day | ORAL | Status: DC

## 2019-10-25 MED ORDER — CEFAZOLIN SODIUM-DEXTROSE 2-4 GM/100ML-% IV SOLN
INTRAVENOUS | Status: AC
  Filled 2019-10-25: qty 100

## 2019-10-25 MED ORDER — LOSARTAN POTASSIUM 50 MG PO TABS
100.0000 mg | ORAL_TABLET | Freq: Every day | ORAL | Status: DC
  Administered 2019-10-26: 100 mg via ORAL
  Filled 2019-10-25: qty 2

## 2019-10-25 MED ORDER — ACETAMINOPHEN 325 MG PO TABS: 325.0000 mg | ORAL_TABLET | ORAL | Status: DC | PRN

## 2019-10-25 MED ORDER — GLYCOPYRROLATE 0.2 MG/ML IJ SOLN
INTRAMUSCULAR | Status: DC | PRN
  Administered 2019-10-25 (×2): .1 mg via INTRAVENOUS

## 2019-10-25 MED ORDER — CLOPIDOGREL BISULFATE 75 MG PO TABS
ORAL_TABLET | ORAL | Status: AC
  Administered 2019-10-25: 75 mg via ORAL
  Filled 2019-10-25: qty 1

## 2019-10-25 MED ORDER — LIDOCAINE HCL (PF) 1 % IJ SOLN
INTRAMUSCULAR | Status: AC
  Filled 2019-10-25: qty 30

## 2019-10-25 MED ORDER — PHENYLEPHRINE 40 MCG/ML (10ML) SYRINGE FOR IV PUSH (FOR BLOOD PRESSURE SUPPORT)
PREFILLED_SYRINGE | INTRAVENOUS | Status: AC
  Filled 2019-10-25: qty 10

## 2019-10-25 MED ORDER — PHENYLEPHRINE HCL-NACL 10-0.9 MG/250ML-% IV SOLN
INTRAVENOUS | Status: DC | PRN
  Administered 2019-10-25: 35 ug/min via INTRAVENOUS

## 2019-10-25 MED ORDER — PANTOPRAZOLE SODIUM 40 MG PO TBEC
40.0000 mg | DELAYED_RELEASE_TABLET | Freq: Every day | ORAL | Status: DC
  Administered 2019-10-26: 40 mg via ORAL
  Filled 2019-10-25: qty 1

## 2019-10-25 MED ORDER — TAMSULOSIN HCL 0.4 MG PO CAPS
0.4000 mg | ORAL_CAPSULE | Freq: Every day | ORAL | Status: DC
  Administered 2019-10-26: 0.4 mg via ORAL
  Filled 2019-10-25: qty 1

## 2019-10-25 MED ORDER — ROCURONIUM BROMIDE 10 MG/ML (PF) SYRINGE
PREFILLED_SYRINGE | INTRAVENOUS | Status: DC | PRN
  Administered 2019-10-25: 50 mg via INTRAVENOUS

## 2019-10-25 MED ORDER — SODIUM CHLORIDE 0.9 % IV SOLN: 500.0000 mL | Freq: Once | INTRAVENOUS | Status: DC | PRN

## 2019-10-25 MED ORDER — LACTATED RINGERS IV SOLN: INTRAVENOUS | Status: DC | PRN

## 2019-10-25 MED ORDER — FINASTERIDE 5 MG PO TABS
5.0000 mg | ORAL_TABLET | Freq: Every evening | ORAL | Status: DC
  Administered 2019-10-25: 5 mg via ORAL
  Filled 2019-10-25: qty 1

## 2019-10-25 MED ORDER — PROPOFOL 10 MG/ML IV BOLUS
INTRAVENOUS | Status: DC | PRN
  Administered 2019-10-25: 150 mg via INTRAVENOUS

## 2019-10-25 MED ORDER — PROTAMINE SULFATE 10 MG/ML IV SOLN
INTRAVENOUS | Status: DC | PRN
  Administered 2019-10-25: 50 mg via INTRAVENOUS

## 2019-10-25 MED ORDER — SUCCINYLCHOLINE CHLORIDE 20 MG/ML IJ SOLN
INTRAMUSCULAR | Status: DC | PRN
  Administered 2019-10-25: 140 mg via INTRAVENOUS

## 2019-10-25 MED ORDER — DEXAMETHASONE SODIUM PHOSPHATE 10 MG/ML IJ SOLN
INTRAMUSCULAR | Status: DC | PRN
  Administered 2019-10-25: 4 mg via INTRAVENOUS

## 2019-10-25 MED ORDER — SUCCINYLCHOLINE CHLORIDE 200 MG/10ML IV SOSY
PREFILLED_SYRINGE | INTRAVENOUS | Status: AC
  Filled 2019-10-25: qty 10

## 2019-10-25 MED ORDER — SODIUM CHLORIDE 0.9 % IV SOLN
INTRAVENOUS | Status: DC | PRN
  Administered 2019-10-25: 500 mL

## 2019-10-25 MED ORDER — LIDOCAINE 2% (20 MG/ML) 5 ML SYRINGE
INTRAMUSCULAR | Status: AC
  Filled 2019-10-25: qty 5

## 2019-10-25 MED ORDER — EPHEDRINE SULFATE 50 MG/ML IJ SOLN
INTRAMUSCULAR | Status: DC | PRN
  Administered 2019-10-25: 2.5 mg via INTRAVENOUS
  Administered 2019-10-25: 5 mg via INTRAVENOUS
  Administered 2019-10-25 (×2): 2.5 mg via INTRAVENOUS

## 2019-10-25 MED ORDER — EPHEDRINE 5 MG/ML INJ
INTRAVENOUS | Status: AC
  Filled 2019-10-25: qty 10

## 2019-10-25 MED ORDER — CHLORHEXIDINE GLUCONATE 0.12 % MT SOLN: 15.0000 mL | Freq: Once | OROMUCOSAL | Status: AC

## 2019-10-25 MED ORDER — 0.9 % SODIUM CHLORIDE (POUR BTL) OPTIME
TOPICAL | Status: DC | PRN
  Administered 2019-10-25 (×2): 1000 mL

## 2019-10-25 SURGICAL SUPPLY — 79 items
BAG BANDED W/RUBBER/TAPE 36X54 (MISCELLANEOUS) ×3 IMPLANT
BALLN STERLING RX 6X40X80 (BALLOONS) ×3
BALLOON STERLING RX 6X40X80 (BALLOONS) ×2 IMPLANT
CANISTER SUCT 3000ML PPV (MISCELLANEOUS) ×3 IMPLANT
CATH ROBINSON RED A/P 18FR (CATHETERS) IMPLANT
CLIP VESOCCLUDE MED 6/CT (CLIP) ×3 IMPLANT
CLIP VESOCCLUDE SM WIDE 6/CT (CLIP) ×3 IMPLANT
COVER DOME SNAP 22 D (MISCELLANEOUS) ×3 IMPLANT
COVER PROBE W GEL 5X96 (DRAPES) ×3 IMPLANT
COVER WAND RF STERILE (DRAPES) ×3 IMPLANT
DERMABOND ADVANCED (GAUZE/BANDAGES/DRESSINGS) ×1
DERMABOND ADVANCED .7 DNX12 (GAUZE/BANDAGES/DRESSINGS) ×2 IMPLANT
DRAIN CHANNEL 15F RND FF W/TCR (WOUND CARE) IMPLANT
DRAPE FEMORAL ANGIO 80X135IN (DRAPES) ×3 IMPLANT
DRAPE INCISE IOBAN 66X45 STRL (DRAPES) ×3 IMPLANT
ELECT REM PT RETURN 9FT ADLT (ELECTROSURGICAL) ×3
ELECTRODE REM PT RTRN 9FT ADLT (ELECTROSURGICAL) ×2 IMPLANT
EVACUATOR SILICONE 100CC (DRAIN) IMPLANT
GLOVE BIO SURGEON STRL SZ7.5 (GLOVE) ×6 IMPLANT
GLOVE BIOGEL PI IND STRL 6.5 (GLOVE) ×4 IMPLANT
GLOVE BIOGEL PI IND STRL 7.0 (GLOVE) ×4 IMPLANT
GLOVE BIOGEL PI IND STRL 8 (GLOVE) ×2 IMPLANT
GLOVE BIOGEL PI INDICATOR 6.5 (GLOVE) ×2
GLOVE BIOGEL PI INDICATOR 7.0 (GLOVE) ×2
GLOVE BIOGEL PI INDICATOR 8 (GLOVE) ×1
GLOVE SURG SS PI 6.5 STRL IVOR (GLOVE) ×3 IMPLANT
GOWN STRL REUS W/ TWL LRG LVL3 (GOWN DISPOSABLE) ×8 IMPLANT
GOWN STRL REUS W/ TWL XL LVL3 (GOWN DISPOSABLE) ×2 IMPLANT
GOWN STRL REUS W/TWL LRG LVL3 (GOWN DISPOSABLE) ×4
GOWN STRL REUS W/TWL XL LVL3 (GOWN DISPOSABLE) ×1
GUIDEWIRE ENROUTE 0.014 (WIRE) ×3 IMPLANT
HEMOSTAT SNOW SURGICEL 2X4 (HEMOSTASIS) ×3 IMPLANT
INSERT FOGARTY SM (MISCELLANEOUS) IMPLANT
INTRODUCER KIT GALT 7CM (INTRODUCER) ×1
IV ADAPTER SYR DOUBLE MALE LL (MISCELLANEOUS) IMPLANT
KIT BASIN OR (CUSTOM PROCEDURE TRAY) ×3 IMPLANT
KIT ENCORE 26 ADVANTAGE (KITS) ×3 IMPLANT
KIT INTRODUCER GALT 7 (INTRODUCER) ×2 IMPLANT
KIT TURNOVER KIT B (KITS) ×3 IMPLANT
NEEDLE HYPO 25GX1X1/2 BEV (NEEDLE) IMPLANT
NEEDLE PERC 18GX7CM (NEEDLE) ×3 IMPLANT
NEEDLE SPNL 20GX3.5 QUINCKE YW (NEEDLE) IMPLANT
NS IRRIG 1000ML POUR BTL (IV SOLUTION) ×6 IMPLANT
PACK CAROTID (CUSTOM PROCEDURE TRAY) ×3 IMPLANT
PACK UNIVERSAL I (CUSTOM PROCEDURE TRAY) ×3 IMPLANT
PAD ARMBOARD 7.5X6 YLW CONV (MISCELLANEOUS) ×6 IMPLANT
POSITIONER HEAD DONUT 9IN (MISCELLANEOUS) ×3 IMPLANT
PROTECTION STATION PRESSURIZED (MISCELLANEOUS) ×3
SET MICROPUNCTURE 5F STIFF (MISCELLANEOUS) ×3 IMPLANT
SHEATH PINNACLE 5FR (SHEATH) IMPLANT
SHUNT CAROTID BYPASS 10 (VASCULAR PRODUCTS) IMPLANT
SHUNT CAROTID BYPASS 12FRX15.5 (VASCULAR PRODUCTS) IMPLANT
STATION PROTECTION PRESSURIZED (MISCELLANEOUS) ×2 IMPLANT
STENT TRANSCAROTID SYSTEM 8X40 (Permanent Stent) ×3 IMPLANT
STOPCOCK 4 WAY LG BORE MALE ST (IV SETS) ×3 IMPLANT
SUT ETHILON 3 0 PS 1 (SUTURE) IMPLANT
SUT MNCRL AB 4-0 PS2 18 (SUTURE) ×3 IMPLANT
SUT PROLENE 5 0 C 1 24 (SUTURE) ×3 IMPLANT
SUT PROLENE 6 0 BV (SUTURE) IMPLANT
SUT PROLENE 7 0 BV 1 (SUTURE) ×3 IMPLANT
SUT SILK 2 0 PERMA HAND 18 BK (SUTURE) ×6 IMPLANT
SUT SILK 2 0 SH CR/8 (SUTURE) IMPLANT
SUT SILK 3 0 (SUTURE)
SUT SILK 3-0 18XBRD TIE 12 (SUTURE) IMPLANT
SUT VIC AB 3-0 SH 27 (SUTURE) ×1
SUT VIC AB 3-0 SH 27X BRD (SUTURE) ×2 IMPLANT
SYR 10ML LL (SYRINGE) ×9 IMPLANT
SYR 20ML LL LF (SYRINGE) ×3 IMPLANT
SYR 5ML LL (SYRINGE) ×3 IMPLANT
SYR BULB IRRIG 60ML STRL (SYRINGE) ×3 IMPLANT
SYR CONTROL 10ML LL (SYRINGE) IMPLANT
SYSTEM TRANSCAROTID NEUROPRTCT (MISCELLANEOUS) ×2 IMPLANT
TOWEL GREEN STERILE (TOWEL DISPOSABLE) ×3 IMPLANT
TRANSCAROTID NEUROPROTECT SYS (MISCELLANEOUS) ×3
TUBING ART PRESS 48 MALE/FEM (TUBING) IMPLANT
TUBING EXTENTION W/L.L. (IV SETS) IMPLANT
WATER STERILE IRR 1000ML POUR (IV SOLUTION) ×3 IMPLANT
WIRE AMPLATZ SS-J .035X180CM (WIRE) IMPLANT
WIRE BENTSON .035X145CM (WIRE) ×3 IMPLANT

## 2019-10-25 NOTE — Progress Notes (Signed)
Pt received from PACU. VSS. Neuro intact. R neck and L groin incisions clean, dry and intact. Pt oriented to room and unit. Will continue to monitor.  Versie Starks, RN

## 2019-10-25 NOTE — H&P (Signed)
History and Physical Interval Note:  10/25/2019 7:19 AM  Maggie SchwalbeKeith M Djordjevic  has presented today for surgery, with the diagnosis of BILATERAL CAROTID STENOSIS.  The various methods of treatment have been discussed with the patient and family. After consideration of risks, benefits and other options for treatment, the patient has consented to  Procedure(s): RIGHT TRANSCAROTID ARTERY REVASCULARIZATION (Right) as a surgical intervention.  The patient's history has been reviewed, patient examined, no change in status, stable for surgery.  I have reviewed the patient's chart and labs.  Questions were answered to the patient's satisfaction.    Right TCAR.  Risks and benefits discussed again including bleeding, infection, hematoma, cranial nerve injury, perioperative stroke etc.  Cephus Shellinghristopher J Yohanna Tow  Patient name: Maggie SchwalbeKeith M Yakubov          MRN: [id number]        DOB: 06/15/1935          Sex: male  REASON FOR CONSULT: F/U for right carotid stenosis after CTA neck  HPI: Maggie SchwalbeKeith M Gadd is a 84 y.o. male, with history of hypertension, hyperlipidemia, coronary artery disease status post CABG, peripheral arterial disease, tobacco abuse that presents that presents to discuss right carotid stenosis after CTA neck.  He was initially seen several weeks ago after referral from his PCP and felt that his legs feel weak at times.  He had no rest pain or nonhealing wounds otherwise.  He had a history of claudication.  He described [place] [facility] [place] and we had no records of this.  He had no surveillance over the last 6 to 7 years.  [facility] had [facility] with ABIs of 0.67 on the right 0.55 on the left with monophasic waveforms at the ankle.  Ultimately we were able to get records from [place] and in review of the records he has had a left common iliac artery angioplasty with stent placement in 2010 for claudication this appears to be an 8 x 20.  He also had a left SFA angioplasty  that was done in 2013.  Also an operative note for coronary bypass grafting x3 with left internal mammary to the LAD, saphenous vein graft to the OM and saphenous vein graft to the PDA in 2010.  His daughter was here at last visit and noted that he had bilateral carotid endarterectomies with a subsequent right carotid stent from a transfemoral approach.  On surveillance of the carotids we noted that he had a high-grade stenosis in the right carotid stent that was placed in [place].  I subsequently sent him for a CTA neck to evaluate for TCAR versus transfemoral approach to treat that.  He remains on aspirin Plavix.  No neurologic events since last evaluation.      Past Medical History:  Diagnosis Date  . BPH (benign prostatic hyperplasia)   . Gout, unspecified    history x1  . Hypertension   . Myocardial infarction [facility](HCC) 2011         Past Surgical History:  Procedure Laterality Date  . APPENDECTOMY    . CATARACT EXTRACTION W/PHACO Right 05/27/2019   Procedure: CATARACT EXTRACTION PHACO AND INTRAOCULAR LENS PLACEMENT (IOC);  Surgeon: Fabio PierceWrzosek, James, MD;  Location: [facility];  Service: Ophthalmology;  Laterality: Right;  CDE: 15.37  . CATARACT EXTRACTION W/PHACO Left 06/14/2019   Procedure: CATARACT EXTRACTION PHACO AND INTRAOCULAR LENS PLACEMENT LEFT EYE;  Surgeon: Fabio PierceWrzosek, James, MD;  Location: [facility];  Service: Ophthalmology;  Laterality: Left;  CDE: 20.79  . CORONARY ARTERY BYPASS GRAFT     triple bypass 2011  . HERNIA REPAIR     Rih  . TONSILLECTOMY      History reviewed. No pertinent family history.  SOCIAL HISTORY: Social History        Socioeconomic History  . Marital status: Widowed    Spouse name: Not on file  . Number of children: Not on file  . Years of education: Not on file  . Highest education level: Not on file  Occupational History  . Not on file  Tobacco Use  . Smoking status: Current Every Day Smoker    Packs/day: 0.50    Years:  65.00    Pack years: 32.50    Types: Cigarettes  . Smokeless tobacco: Never Used  Vaping Use  . Vaping Use: Never used  Substance and Sexual Activity  . Alcohol use: Not Currently  . Drug use: Never  . Sexual activity: Not Currently  Other Topics Concern  . Not on file  Social History Narrative  . Not on file   Social Determinants of Health      Financial Resource Strain:   . Difficulty of Paying Living Expenses:   Food Insecurity:   . Worried About Programme researcher, broadcasting/film/video in the Last Year:   . Barista in the Last Year:   Transportation Needs:   . Freight forwarder (Medical):   Marland Kitchen Lack of Transportation (Non-Medical):   Physical Activity:   . Days of Exercise per Week:   . Minutes of Exercise per Session:   Stress:   . Feeling of Stress :   Social Connections:   . Frequency of Communication with Friends and Family:   . Frequency of Social Gatherings with Friends and Family:   . Attends Religious Services:   . Active Member of Clubs or Organizations:   . Attends Banker Meetings:   Marland Kitchen Marital Status:   Intimate Partner Violence:   . Fear of Current or Ex-Partner:   . Emotionally Abused:   Marland Kitchen Physically Abused:   . Sexually Abused:          Allergies  Allergen Reactions  . Drug Class [Morphine And Related] Other (See Comments)    Hallucinations.          Current Outpatient Medications  Medication Sig Dispense Refill  . aspirin 325 MG tablet Take 325 mg by mouth every evening.    . clopidogrel (PLAVIX) 75 MG tablet Take 75 mg by mouth daily.    . finasteride (PROSCAR) 5 MG tablet Take 5 mg by mouth every evening.    . ILEVRO 0.3 % ophthalmic suspension Place 1 drop into the right eye daily.    Marland Kitchen losartan (COZAAR) 100 MG tablet Take 100 mg by mouth daily.    Marland Kitchen lovastatin (MEVACOR) 20 MG tablet Take 20 mg by mouth daily.    Marland Kitchen moxifloxacin (VIGAMOX) 0.5 % ophthalmic solution Apply 1 drop to eye 3 (three) times daily.     . Omega-3 Fatty Acids (FISH OIL) 1000 MG CAPS Take 1,000 capsules by mouth 2 (two) times daily.    . tamsulosin (FLOMAX) 0.4 MG CAPS capsule Take 0.4 mg by mouth daily.    . prednisoLONE acetate (PRED FORTE) 1 % ophthalmic suspension Place 1 drop into the right eye 3 (three) times daily. (Patient not taking: Reported on 09/10/2019)     No current facility-administered medications for this visit.    REVIEW  OF SYSTEMS:  [X]  denotes positive finding, [ ]  denotes negative finding Cardiac  Comments:  Chest pain or chest pressure:    Shortness of breath upon exertion:    Short of breath when lying flat:    Irregular heart rhythm:        Vascular    Pain in calf, thigh, or hip brought on by ambulation: x   Pain in feet at night that wakes you up from your sleep:     Blood clot in your veins:    Leg swelling:         Pulmonary    Oxygen at home:    Productive cough:     Wheezing:         Neurologic    Sudden weakness in arms or legs:     Sudden numbness in arms or legs:     Sudden onset of difficulty speaking or slurred speech:    Temporary loss of vision in one eye:     Problems with dizziness:         Gastrointestinal    Blood in stool:     Vomited blood:         Genitourinary    Burning when urinating:     Blood in urine:        Psychiatric    Major depression:         Hematologic    Bleeding problems:    Problems with blood clotting too easily:        Skin    Rashes or ulcers:        Constitutional    Fever or chills:      PHYSICAL EXAM:     Vitals:   09/10/19 1147 09/10/19 1150  BP: (!) 104/60 (!) 106/60  Pulse: 70 70  Resp: 18   Temp: (!) 97.5 F (36.4 C)   TempSrc: Temporal   SpO2: 99%   Weight: 190 lb (86.2 kg)   Height: 5\' 7"  (1.702 m)     GENERAL: The patient is a well-nourished male, in no acute distress. The vital signs are  documented above. CARDIAC: There is a regular rate and rhythm.  VASCULAR:  Bilateral neck incisions Palpable femoral pulses bilaterally No palpable popliteal or pedal pulses No lower extremity tissue loss PULMONARY: There is good air exchange bilaterally without wheezing or rales. ABDOMEN: Soft and non-tender with normal pitched bowel sounds.  MUSCULOSKELETAL: There are no major deformities or cyanosis. NEUROLOGIC: No focal weakness or paresthesias are detected.   DATA:   Carotid duplex today shows high-grade stenosis in the proximal right carotid stent with velocities of 428/130 and no significant disease in the left ICA  CTA neck on my review shows a high-grade stenosis in the proximal common carotid artery stent on the right.  On the left he had a previous carotid endarterectomy that is widely patent with no significant stenosis.  Assessment/Plan:  84 year old male initially presented for evaluation of PAD.  On further evaluation became apparent that he had bilateral carotid endarterectomies and when we did surveillance was noted to have a high-grade stenosis in a right carotid stent that was subsequently placed in 09/12/19 after his carotid endarterectomy site got recurrent stenosis.  I sent in for CTA neck and on my review he does have a high-grade stenosis in the proximal portion of the right common carotid stent.  I am hopeful we can schedule for TCAR to treat the stenosis in the right carotid stent.  I  am going to review the images with TCAR representative and discussed with patient if not would require transfemoral approach to treat his high-grade stenosis.  This is certainly higher risk and I quoted him a 6 to 7% risk of perioperative stroke.  Discussed other option would be medical management with continued surveillance.  Ultimately he wants intervention.  Discussed that he is going to have to stop smoking given this will now be his third carotid intervention.  He also had an  incidental finding of a 12 mm spiculated nodule in the left lung on CT.  He does not have any prior knowledge of this.  Ominous send to pulmonology for further evaluation.  I put told in this plan of care today.  Cephus Shelling, MD [facility] [place] Office: [phone]

## 2019-10-25 NOTE — Op Note (Addendum)
Date: 10/25/2019  Preoperative diagnosis: Recurrent high-grade asymptomatic right carotid stenosis within right carotid stent  Postoperative diagnosis: Same  Procedure:  1. US guided access of left common femoral vein for placement of venous TCAR sheath 2. Right transcarotid artery stent with 8 mm x 40 mm Enroute stent with flow reversal embolic protection (TCAR)  Surgeon: Dr. Cephus Shelling, MD  Assistant: Dr. Fabienne Bruns, MD  Indications: Patient is a 84 year old male who was seen in clinic initially for evaluation of peripheral vascular disease.  Ultimately he was transferring his care here to [facility] after previously being under the care of a vascular surgeon in [place].  Ultimately a carotid duplex on surveillance showed a high-grade recurrent stenosis in the right carotid after previous endarterectomy and on further work-up we found that he had actually had a carotid stent after a previous carotid endarterectomy.  He presents today for planned TCAR with flow reversal after risk benefits discussed.  An assistant was needed for exposure and to expedite the case.  Findings: Right carotid stent extending from the distal common carotid into the ICA had significant thrombus with recurrent high-grade stenosis within the stent.  This was predilated with a 6 mm angioplasty balloon and primarily stented with an 8 mm x 40 mm Enroute stent to realign the previous stent after flow reversal was initiated.  This did require post dilation again with a 6 mm angioplasty balloon after stent placement.  No significant residual stenosis.  No dissection.  Distal ICA fills without flow limitation or dissection.  Anesthesia: General  Fluoroscopy time: 6.3 minutes  Procedure time: 58 minutes  Contrast: 25 mL  Details: Patient was taken to the operating room after informed consent was obtained.  Placed on operative table in supine position.  General endotracheal anesthesia was induced.   Ultimately the right neck was extended and turned to the left after putting a bump under his shoulder.  I marked his carotid artery just above the clavicle.  Ultimately both groins and the right neck were then prepped and draped in the usual sterile fashion.  Dr. Darrick Penna initially started in the left groin and use ultrasound to evaluate the left common femoral vein that was patent and image was saved.  This was accessed with a micro access needle placed a microwire and ultimately a Bentson wire and then the TCAR flow reversal sheath in the left common femoral vein under ultrasound guidance.  I made a transverse incision above the right clavicle dissected through the platysma with Bovie cautery splayed the avascular plane between the heads of the sternocleidomastoid and identified the internal jugular vein.  This was mobilized laterally identified the common carotid and vagus nerve.  I got a vessel loop around the common carotid just above the clavicle.  Patient was given 100 units per kilogram heparin at that time.  I then placed a pursestring in the anterior wall the common carotid artery in our exposure with a 5-0 Prolene.  Ultimately given he had a very short common carotid that we could place our sheath then given the previous carotid stent extended down into the common carotid we elected to tunnel our access placement in the right chest wall.  I made a counterincision with 11 blade just below the clavicle and created a tunnel in the subcutaneous tissue above the clavicle.  Subsequently placed a micro access needle through this tunnel to access the common carotid and accessed inside our pursestring placed a microwire and then a micro sheath.  We then got right carotid angiogram imaging that confirmed high-grade recurrent stenosis within the carotid stent.  We marked the bottom of the lesion and ultimately once we had a therapeutic ACT above 250 I placed a J-wire and then tunneled the large TCAR arterial sheath  also through the subcutaneous tunnel in the chest wall to the artery while partner Dr. Darrick Penna pulled some gentle pressure on the vessel loop around the base of the artery to give countertraction.  Ultimately we then connected the flow reversal device with filter protection and this was connected to the venous sheath in the groin and we had good flow reversal.  We again got two views to ensure no dissection in the common carotid where we reaccessed the artery with the sheath.  I then performed a TCAR timeout ensuring that an adequate blood pressure as well as therapeutic ACT.  We then crossed the lesion with an 014 wire and predilated this with a 6 mm x 40 mm angioplasty balloon.  This was primarily stented with an 8 mm x 40 mm Enroute stent.  This did require post dilation given we had greater than 30% residual stenosis again with a 6 mm balloon.  Ultimately on final carotid angiogram the new stent was widely patent with no residual stenosis with good filling of the distal ICA and no dissection.  That point time we disconnected the flow reversal sheath.  The sheath was pulled out of the common carotid artery we tied down the pursestring with 5-0 Prolene with good hemostasis.  We had good Doppler flow in the common carotid.  50 mg protamine was given for reversal.  Dr. Darrick Penna pulled the sheath in the left common femoral vein held manual pressure.  I washed out the neck and close this with 3-0 Vicryl 4-0 Monocryl in the skin and Dermabond.  The stab incision was also closed with a 4-0 Monocryl.  He was awakened from anesthesia with no new deficits.  Complication: None  Condition: Stable  Cephus Shelling, MD [facility] [place] Office: [phone]   Cephus Shelling

## 2019-10-25 NOTE — Transfer of Care (Signed)
Immediate Anesthesia Transfer of Care Note  Patient: Terry Fitzgerald  Procedure(s) Performed: RIGHT TRANSCAROTID ARTERY REVASCULARIZATION (Right Neck) ULTRASOUND GUIDANCE FOR VASCULAR ACCESS (Left Groin)  Patient Location: PACU  Anesthesia Type:General  Level of Consciousness: awake and alert   Airway & Oxygen Therapy: Patient Spontanous Breathing and Patient connected to nasal cannula oxygen  Post-op Assessment: Report given to RN, Post -op Vital signs reviewed and stable, Patient moving all extremities X 4 and Patient able to stick tongue midline  Post vital signs: Reviewed and stable  Last Vitals:  Vitals Value Taken Time  BP 123/65 10/25/19 0930  Temp    Pulse 87 10/25/19 0933  Resp 17 10/25/19 0933  SpO2 97 % 10/25/19 0933  Vitals shown include unvalidated device data.  Last Pain:  Vitals:   10/25/19 0615  PainSc: 2       Patients Stated Pain Goal: 0 (10/25/19 0615)  Complications: No complications documented.

## 2019-10-25 NOTE — Discharge Instructions (Signed)
   [facility]  Discharge Instructions   Carotid Endarterectomy (CEA)  Please refer to the following instructions for your post-procedure care. Your surgeon or physician assistant will discuss any changes with you.  Activity  You are encouraged to walk as much as you can. You can slowly return to normal activities but must avoid strenuous activity and heavy lifting until your doctor tell you it's OK. Avoid activities such as vacuuming or swinging a golf club. You can drive after one week if you are comfortable and you are no longer taking prescription pain medications. It is normal to feel tired for serval weeks after your surgery. It is also normal to have difficulty with sleep habits, eating, and bowel movements after surgery. These will go away with time.  Bathing/Showering  You may shower after you come home. Do not soak in a bathtub, hot tub, or swim until the incision heals completely.  Incision Care  Shower every day. Clean your incision with mild soap and water. Pat the area dry with a clean towel. You do not need a bandage unless otherwise instructed. Do not apply any ointments or creams to your incision. You may have skin glue on your incision. Do not peel it off. It will come off on its own in about one week. Your incision may feel thickened and raised for several weeks after your surgery. This is normal and the skin will soften over time. For Men Only: It's OK to shave around the incision but do not shave the incision itself for 2 weeks. It is common to have numbness under your chin that could last for several months.  Diet  Resume your normal diet. There are no special food restrictions following this procedure. A low fat/low cholesterol diet is recommended for all patients with vascular disease. In order to heal from your surgery, it is CRITICAL to get adequate nutrition. Your body requires vitamins, minerals, and protein. Vegetables are the best  source of vitamins and minerals. Vegetables also provide the perfect balance of protein. Processed food has little nutritional value, so try to avoid this.        Medications  Resume taking all of your medications unless your doctor or physician assistant tells you not to. If your incision is causing pain, you may take over-the- counter pain relievers such as acetaminophen (Tylenol). If you were prescribed a stronger pain medication, please be aware these medications can cause nausea and constipation. Prevent nausea by taking the medication with a snack or meal. Avoid constipation by drinking plenty of fluids and eating foods with a high amount of fiber, such as fruits, vegetables, and grains. Do not take Tylenol if you are taking prescription pain medications.  Follow Up  Our office will schedule a follow up appointment 2-3 weeks following discharge.  Please call us immediately for any of the following conditions  Increased pain, redness, drainage (pus) from your incision site. Fever of 101 degrees or higher. If you should develop stroke (slurred speech, difficulty swallowing, weakness on one side of your body, loss of vision) you should call 911 and go to the nearest emergency room.  Reduce your risk of vascular disease:  Stop smoking. If you would like help call [facility] at 1-800-QUIT-NOW (1-800-784-8669) or [facility] at 336-586-4000. Manage your cholesterol Maintain a desired weight Control your diabetes Keep your blood pressure down  If you have any questions, please call the office at [phone].   

## 2019-10-25 NOTE — Anesthesia Postprocedure Evaluation (Signed)
Anesthesia Post Note  Patient: Terry Fitzgerald  Procedure(s) Performed: RIGHT TRANSCAROTID ARTERY REVASCULARIZATION (Right Neck) ULTRASOUND GUIDANCE FOR VASCULAR ACCESS (Left Groin)     Anesthesia Post Evaluation No complications documented.  Last Vitals:  Vitals:   10/25/19 1602 10/25/19 1700  BP: 121/84 (!) 130/56  Pulse: 93 81  Resp: 18 18  Temp:    SpO2: 99% 100%    Last Pain:  Vitals:   10/25/19 1517  TempSrc: Oral  PainSc:                  Manjinder Breau

## 2019-10-25 NOTE — Progress Notes (Signed)
  Day of Surgery Note    Subjective:  No complaints. Sitting up eating lunch.   Vitals:   10/25/19 1517 10/25/19 1602  BP:  121/84  Pulse:  93  Resp:  18  Temp: 97.8 F (36.6 C)   SpO2:  99%   sys BP range 110-120s Incisions:   Well approximated. No hematoma Extremities:  Moves all well. Cardiac:  RRR Lungs:  CTAB Neuro: A and O times 4. CN 2-12 grossly intact. Speech clear. Face symmetrical   Assessment/Plan:  This is a 84 y.o. male who is s/p right TCAR. Neuro intact. Hemodynamically stable.   -Continue current treatment plan and monitoring.   Wendi Maya, PA-C 10/25/2019 4:05 PM [phone]

## 2019-10-25 NOTE — Anesthesia Procedure Notes (Signed)
Procedure Name: Intubation Date/Time: 10/25/2019 7:41 AM Performed by: Inda Coke, CRNA Pre-anesthesia Checklist: Patient identified, Emergency Drugs available, Suction available and Patient being monitored Patient Re-evaluated:Patient Re-evaluated prior to induction Oxygen Delivery Method: Circle System Utilized Preoxygenation: Pre-oxygenation with 100% oxygen Induction Type: IV induction and Rapid sequence Ventilation: Mask ventilation without difficulty Laryngoscope Size: Mac and 4 Grade View: Grade I Tube type: Oral Tube size: 7.5 mm Number of attempts: 1 Airway Equipment and Method: Stylet and Oral airway Placement Confirmation: ETT inserted through vocal cords under direct vision,  positive ETCO2 and breath sounds checked- equal and bilateral Secured at: 23 cm Tube secured with: Tape Dental Injury: Teeth and Oropharynx as per pre-operative assessment

## 2019-10-25 NOTE — Anesthesia Procedure Notes (Signed)
Arterial Line Insertion Start/End9/11/2019 6:50 AM, 10/25/2019 7:00 AM Performed by: Adair Laundry, CRNA, CRNA  Preanesthetic checklist: patient identified, IV checked, site marked, risks and benefits discussed, surgical consent, monitors and equipment checked, pre-op evaluation, timeout performed and anesthesia consent Lidocaine 1% used for infiltration Left, radial was placed Catheter size: 20 G Hand hygiene performed  and maximum sterile barriers used  Allen's test indicative of satisfactory collateral circulation Attempts: 2 Procedure performed without using ultrasound guided technique. Following insertion, dressing applied and Biopatch. Post procedure assessment: normal  Patient tolerated the procedure well with no immediate complications.

## 2019-10-26 LAB — BASIC METABOLIC PANEL
Anion gap: 9 (ref 5–15)
BUN: 20 mg/dL (ref 8–23)
CO2: 20 mmol/L — ABNORMAL LOW (ref 22–32)
Calcium: 8.9 mg/dL (ref 8.9–10.3)
Chloride: 104 mmol/L (ref 98–111)
Creatinine, Ser: 1.18 mg/dL (ref 0.61–1.24)
GFR calc Af Amer: >60 mL/min (ref >60)
GFR calc non Af Amer: 58 mL/min — ABNORMAL LOW (ref >60)
Glucose, Bld: 132 mg/dL — ABNORMAL HIGH (ref 70–99)
Potassium: 4.7 mmol/L (ref 3.5–5.1)
Sodium: 133 mmol/L — ABNORMAL LOW (ref 135–145)

## 2019-10-26 LAB — CBC
HCT: 31.9 % — ABNORMAL LOW (ref 39.0–52.0)
Hemoglobin: 10.3 g/dL — ABNORMAL LOW (ref 13.0–17.0)
MCH: 28.4 pg (ref 26.0–34.0)
MCHC: 32.3 g/dL (ref 30.0–36.0)
MCV: 87.9 fL (ref 80.0–100.0)
Platelets: 228 10*3/uL (ref 150–400)
RBC: 3.63 MIL/uL — ABNORMAL LOW (ref 4.22–5.81)
RDW: 12.9 % (ref 11.5–15.5)
WBC: 16.2 10*3/uL — ABNORMAL HIGH (ref 4.0–10.5)
nRBC: 0 % (ref 0.0–0.2)

## 2019-10-26 MED ORDER — TRAMADOL HCL 50 MG PO TABS
50.0000 mg | ORAL_TABLET | Freq: Four times a day (QID) | ORAL | 0 refills | Status: DC | PRN
Start: 2019-10-26 — End: 2020-01-01

## 2019-10-26 NOTE — Progress Notes (Signed)
Pt ambulated x 350 feet around unit pt tolerated well

## 2019-10-26 NOTE — Progress Notes (Signed)
D/C instructions given to patient. Medications and wound care reviewed. All questions answered. IVx2 removed, clean and intact. Daughter to escort pt home.  Versie Starks, RN

## 2019-10-26 NOTE — Progress Notes (Addendum)
[facility]  Subjective  - Doing well this am and ready to go home.   Objective (!) 113/53 71 98.3 F (36.8 C) (Oral) 16 98%  Intake/Output Summary (Last 24 hours) at 10/26/2019 0906 Last data filed at 10/26/2019 0900 Gross per 24 hour  Intake 2907.5 ml  Output 1650 ml  Net 1257.5 ml    Right subclavian/neck incision healing well without hematoma Moving all 4 ext.  No tongue deviation and no facial droop Lungs non labored breathing  Assessment/Planning: POD # 1 right TECAR  He has ambulated and tol PO, as well as voided. Plan to discharge home today  Terry Fitzgerald 10/26/2019 9:06 AM --  Laboratory Lab Results: Recent Labs    10/23/19 1300 10/26/19 0802  WBC 8.7 16.2*  HGB 12.6* 10.3*  HCT 41.4 31.9*  PLT 222 228   BMET Recent Labs    10/23/19 1300 10/26/19 0802  NA 133* 133*  K 5.0 4.7  CL 101 104  CO2 21* 20*  GLUCOSE 87 132*  BUN 17 20  CREATININE 1.16 1.18  CALCIUM 9.7 8.9    COAG Lab Results  Component Value Date   INR 0.9 10/23/2019   No results found for: PTT  I have seen and evaluated the patient. I agree with the PA note as documented above.  84 year old male postop day 1 status post right TCAR.  Incisions look good.  Neurologically intact.  Plan for discharge today aspirin statin Plavix.  Cephus Shelling, MD [facility] Office: [phone]

## 2019-10-28 ENCOUNTER — Encounter (HOSPITAL_COMMUNITY): Payer: Self-pay | Admitting: Vascular Surgery

## 2019-10-28 NOTE — Discharge Summary (Signed)
[facility] Discharge Summary   Patient ID:  Terry Fitzgerald MRN: [id number] DOB/AGE: 1936-01-30 84 y.o.  Admit date: 10/25/2019 Discharge date: 10/28/2019 Date of Surgery: 10/25/2019 Surgeon: Surgeon(s): Cephus Shelling, MD Sherren Kerns, MD  Admission Diagnosis: Bilateral carotid artery stenosis [I65.23] Carotid stenosis [I65.29]  Discharge Diagnoses:  Bilateral carotid artery stenosis [I65.23] Carotid stenosis [I65.29]  Secondary Diagnoses: Past Medical History:  Diagnosis Date  . BPH (benign prostatic hyperplasia)   . Gout, unspecified    history x1  . Hypertension   . Myocardial infarction [facility]) 2011    Procedure(s): RIGHT TRANSCAROTID ARTERY REVASCULARIZATION ULTRASOUND GUIDANCE FOR VASCULAR ACCESS  Discharged Condition: stable  HPI: 84 y/o male with history B carotid endarterectomywith a subsequent right carotid stent from a transfemoral approach  site got recurrent stenosis. I sent in for CTA neck and on my review he does have a high-grade stenosis in the proximal portion of the right common carotid stent.  Plan for TECAR by Dr. Chestine Spore.    Hospital Course:  Terry Fitzgerald is a 84 y.o. male is S/P  Procedure(s): RIGHT TRANSCAROTID ARTERY REVASCULARIZATION ULTRASOUND GUIDANCE FOR VASCULAR ACCESS  Incisions look good.  Neurologically intact.  Plan for discharge  aspirin statin Plavix.    Significant Diagnostic Studies: CBC Lab Results  Component Value Date   WBC 16.2 (H) 10/26/2019   HGB 10.3 (L) 10/26/2019   HCT 31.9 (L) 10/26/2019   MCV 87.9 10/26/2019   PLT 228 10/26/2019    BMET    Component Value Date/Time   NA 133 (L) 10/26/2019 0802   K 4.7 10/26/2019 0802   CL 104 10/26/2019 0802   CO2 20 (L) 10/26/2019 0802   GLUCOSE 132 (H) 10/26/2019 0802   BUN 20 10/26/2019 0802   CREATININE 1.18 10/26/2019 0802   CALCIUM 8.9 10/26/2019 0802   GFRNONAA 58 (L) 10/26/2019 0802   GFRAA >60 10/26/2019 0802   COAG Lab  Results  Component Value Date   INR 0.9 10/23/2019     Disposition:  Discharge to :Home Discharge Instructions    Call MD for:  redness, tenderness, or signs of infection (pain, swelling, bleeding, redness, odor or green/yellow discharge around incision site)   Complete by: As directed    Call MD for:  severe or increased pain, loss or decreased feeling  in affected limb(s)   Complete by: As directed    Call MD for:  temperature >100.5   Complete by: As directed    Resume previous diet   Complete by: As directed      Allergies as of 10/26/2019      Reactions   Drug Class [morphine And Related] Other (See Comments)   Hallucinations.      Medication List    TAKE these medications   aspirin 325 MG tablet Take 325 mg by mouth every evening.   clopidogrel 75 MG tablet Commonly known as: PLAVIX Take 75 mg by mouth daily.   finasteride 5 MG tablet Commonly known as: PROSCAR Take 5 mg by mouth every evening.   losartan 100 MG tablet Commonly known as: COZAAR Take 100 mg by mouth daily.   lovastatin 20 MG tablet Commonly known as: MEVACOR Take 20 mg by mouth daily.   MULTIVITAMIN GUMMIES ADULT PO Take 1 tablet by mouth daily.   tamsulosin 0.4 MG Caps capsule Commonly known as: FLOMAX Take 0.4 mg by mouth daily.   traMADol 50 MG tablet Commonly known as: ULTRAM Take 1 tablet (50  mg total) by mouth every 6 (six) hours as needed for moderate pain.      Verbal and written Discharge instructions given to the patient. Wound care per Discharge AVS  Follow-up Information    Cephus Shelling, MD.   Specialty: Vascular Surgery Why: Office will call you to arrange your appt (sent) Contact information: [address] [place] [address] [phone]               Signed: Mosetta Pigeon 10/28/2019, 11:01 AM --- For VQI Registry use --- Instructions: Press F2 to tab through selections.  Delete question if not applicable.   Modified Rankin score at  D/C (0-6): Rankin Score=0  IV medication needed for:  1. Hypertension: No 2. Hypotension: No  Post-op Complications: No  1. Post-op CVA or TIA: No  If yes: Event classification (right eye, left eye, right cortical, left cortical, verterobasilar, other):   If yes: Timing of event (intra-op, <6 hrs post-op, >=6 hrs post-op, unknown):   2. CN injury: No  If yes: CN  injuried   3. Myocardial infarction: No  If yes: Dx by (EKG or clinical, Troponin):   4.  CHF: No  5.  Dysrhythmia (new): No  6. Wound infection: No  7. Reperfusion symptoms: No  8. Return to OR: No  If yes: return to OR for (bleeding, neurologic, other CEA incision, other):   Discharge medications: Statin use:  Yes ASA use:  Yes Beta blocker use:  No  for medical reason   ACE-Inhibitor use:  No  for medical reason   P2Y12 Antagonist use: [ ]  None, [x ] Plavix, [ ]  Plasugrel, [ ]  Ticlopinine, [ ]  Ticagrelor, [ ]  Other, [ ]  No for medical reason, [ ]  Non-compliant, [ ]  Not-indicated Anti-coagulant use:  [ x] None, [ ]  Warfarin, [ ]  Rivaroxaban, [ ]  Dabigatran, [ ]  Other, [ ]  No for medical reason, [ ]  Non-compliant, [ ]  Not-indicated

## 2019-11-06 ENCOUNTER — Other Ambulatory Visit: Payer: Self-pay | Admitting: *Deleted

## 2019-11-06 DIAGNOSIS — I6523 Occlusion and stenosis of bilateral carotid arteries: Secondary | ICD-10-CM

## 2019-11-12 ENCOUNTER — Encounter: Payer: Medicare HMO | Admitting: Vascular Surgery

## 2019-11-12 ENCOUNTER — Encounter (HOSPITAL_COMMUNITY): Payer: Medicare HMO

## 2019-11-14 ENCOUNTER — Encounter (HOSPITAL_COMMUNITY): Payer: Self-pay | Admitting: Vascular Surgery

## 2019-11-19 ENCOUNTER — Other Ambulatory Visit: Payer: Self-pay | Admitting: *Deleted

## 2019-11-19 ENCOUNTER — Encounter: Payer: Medicare HMO | Admitting: Vascular Surgery

## 2019-11-19 ENCOUNTER — Encounter (HOSPITAL_COMMUNITY): Payer: Medicare HMO

## 2019-11-19 DIAGNOSIS — I6523 Occlusion and stenosis of bilateral carotid arteries: Secondary | ICD-10-CM

## 2019-11-27 ENCOUNTER — Institutional Professional Consult (permissible substitution): Payer: Medicare HMO | Admitting: Pulmonary Disease

## 2019-11-30 DIAGNOSIS — N400 Enlarged prostate without lower urinary tract symptoms: Secondary | ICD-10-CM | POA: Diagnosis not present

## 2019-11-30 DIAGNOSIS — Z7902 Long term (current) use of antithrombotics/antiplatelets: Secondary | ICD-10-CM | POA: Diagnosis not present

## 2019-11-30 DIAGNOSIS — I2510 Atherosclerotic heart disease of native coronary artery without angina pectoris: Secondary | ICD-10-CM | POA: Diagnosis not present

## 2019-11-30 DIAGNOSIS — I739 Peripheral vascular disease, unspecified: Secondary | ICD-10-CM | POA: Diagnosis not present

## 2019-11-30 DIAGNOSIS — I252 Old myocardial infarction: Secondary | ICD-10-CM | POA: Diagnosis not present

## 2019-11-30 DIAGNOSIS — R69 Illness, unspecified: Secondary | ICD-10-CM | POA: Diagnosis not present

## 2019-11-30 DIAGNOSIS — I10 Essential (primary) hypertension: Secondary | ICD-10-CM | POA: Diagnosis not present

## 2019-11-30 DIAGNOSIS — Z8249 Family history of ischemic heart disease and other diseases of the circulatory system: Secondary | ICD-10-CM | POA: Diagnosis not present

## 2019-11-30 DIAGNOSIS — I951 Orthostatic hypotension: Secondary | ICD-10-CM | POA: Diagnosis not present

## 2019-11-30 DIAGNOSIS — E785 Hyperlipidemia, unspecified: Secondary | ICD-10-CM | POA: Diagnosis not present

## 2019-12-10 ENCOUNTER — Encounter (HOSPITAL_COMMUNITY): Payer: Medicare HMO

## 2019-12-10 ENCOUNTER — Encounter: Payer: Medicare HMO | Admitting: Vascular Surgery

## 2020-01-01 ENCOUNTER — Other Ambulatory Visit: Payer: Self-pay

## 2020-01-01 ENCOUNTER — Ambulatory Visit (INDEPENDENT_AMBULATORY_CARE_PROVIDER_SITE_OTHER): Payer: Medicare HMO | Admitting: Internal Medicine

## 2020-01-01 ENCOUNTER — Encounter: Payer: Self-pay | Admitting: Internal Medicine

## 2020-01-01 DIAGNOSIS — F17210 Nicotine dependence, cigarettes, uncomplicated: Secondary | ICD-10-CM | POA: Insufficient documentation

## 2020-01-01 DIAGNOSIS — I10 Essential (primary) hypertension: Secondary | ICD-10-CM | POA: Diagnosis not present

## 2020-01-01 DIAGNOSIS — R911 Solitary pulmonary nodule: Secondary | ICD-10-CM | POA: Diagnosis not present

## 2020-01-01 DIAGNOSIS — M545 Low back pain, unspecified: Secondary | ICD-10-CM | POA: Diagnosis not present

## 2020-01-01 DIAGNOSIS — Z6828 Body mass index (BMI) 28.0-28.9, adult: Secondary | ICD-10-CM | POA: Diagnosis not present

## 2020-01-01 DIAGNOSIS — E782 Mixed hyperlipidemia: Secondary | ICD-10-CM | POA: Diagnosis not present

## 2020-01-01 DIAGNOSIS — R69 Illness, unspecified: Secondary | ICD-10-CM | POA: Diagnosis not present

## 2020-01-01 DIAGNOSIS — I7389 Other specified peripheral vascular diseases: Secondary | ICD-10-CM | POA: Diagnosis not present

## 2020-01-01 DIAGNOSIS — N400 Enlarged prostate without lower urinary tract symptoms: Secondary | ICD-10-CM | POA: Diagnosis not present

## 2020-01-01 NOTE — Assessment & Plan Note (Signed)
Counseled re importance of smoking cessation but did not meet time criteria for separate billing    Also rec: Pt informed of the seriousness of COVID 19 infection as a direct risk to lung health  and safey and to close contacts and should continue to wear a facemask in public and minimize exposure to public locations but especially avoid any area or activity where non-close contacts are not observing distancing or wearing an appropriate face mask.  I strongly recommended she take either of the vaccines available through local drugstores based on updated information on millions of Americans treated with the Moderna and [employer] products  which have proven both safe and  effective even against the new delta variant.            Each maintenance medication was reviewed in detail including emphasizing most importantly the difference between maintenance and prns and under what circumstances the prns are to be triggered using an action plan format where appropriate.  Total time for H and P, chart review, counseling,   and generating customized AVS unique to this office visit / charting = 55 min

## 2020-01-01 NOTE — Assessment & Plan Note (Signed)
Active smoker - incidental from neck CT 09/09/19  LUL spiculated x  94mm  - PET ordered 01/01/2020   CT results reviewed with pt >>>  Good candidate for PET and excisional bx if stage I by PET and if not can refer for navigatinal bx/ ebus   In meantime needs PFTs > ordered today but suspect they'll be fine by clinical eval today.

## 2020-01-01 NOTE — Patient Instructions (Addendum)
My office will call to schedule you a PET scan at your soonest convenience at [facility] and also a PFT same hospital  - same day if possible   If the scan is positive, I will recommend an excisional biopsy followed by possible removal of your Left upper lobe.  The key is to stop smoking completely before smoking completely stops you!    I very strongly recommend you get the moderna or pfizer vaccine as soon as possible based on your risk of dying from the virus  and the proven safety and benefit of these vaccines against even the delta variant.  This can save your life as well as  those of your loved ones,  especially if they are also not vaccinated.    No follow up needed at this office

## 2020-01-01 NOTE — Progress Notes (Signed)
Maggie Schwalbe, male    DOB: 10-Dec-1935, 84 y.o.   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 Pulmonary Consult    Referred by Dr. Sherald Hess for eval of pulmonary nodule noted on CT Angio neck performed 09/09/2019.  He states has occ cough with clear sputum.    Dyspnea: push mower / rake leaves/ [employer] walking  Cough: minimal smoker's hack x 3-4 x and good for the day/ min mucoid Sleep: able to lie flat / one pillow SABA use: none   No obvious day to day or daytime variability or assoc excess/ purulent sputum or mucus plugs or hemoptysis or cp or chest tightness, subjective wheeze or overt sinus or hb symptoms.   Sleeping  without nocturnal  or early am exacerbation  of respiratory  c/o's or need for noct saba. Also denies any obvious fluctuation of symptoms with weather or environmental changes or other aggravating or alleviating factors except as outlined above   No unusual exposure hx or h/o childhood pna/ asthma or knowledge of premature birth.  Current Allergies, Complete Past Medical History, Past Surgical History, Family History, and Social History were reviewed in [employer] record.  ROS  The following are not active complaints unless bolded Hoarseness, sore throat, dysphagia, dental problems, itching, sneezing,  nasal congestion or discharge of excess mucus or purulent secretions, ear ache,   fever, chills, sweats, unintended wt loss or wt gain, classically pleuritic or exertional cp,  orthopnea pnd or arm/hand swelling  or leg swelling, presyncope, palpitations, abdominal  pain, anorexia, nausea, vomiting, diarrhea  or change in bowel habits or change in bladder habits, change in stools or change in urine, dysuria, hematuria,  rash, arthralgias, visual complaints, headache, numbness, weakness or ataxia or problems with walking or coordination,  change in mood or  memory.           Past Medical History:  Diagnosis Date  . BPH (benign prostatic hyperplasia)   . Gout, unspecified    history x1  . Hypertension   . Myocardial infarction [facility]) 2011    Outpatient Medications Prior to Visit  Medication Sig Dispense Refill  . aspirin 325 MG tablet Take 325 mg by mouth every evening.    . clopidogrel (PLAVIX) 75 MG tablet Take 75 mg by mouth daily.    . finasteride (PROSCAR) 5 MG tablet Take 5 mg by mouth every evening.    Marland Kitchen losartan (COZAAR) 100 MG tablet Take 100 mg by mouth daily.    Marland Kitchen lovastatin (MEVACOR) 20 MG tablet Take 20 mg by mouth daily.    . pantoprazole (PROTONIX) 40 MG tablet Take 40 mg by mouth daily.    . tamsulosin (FLOMAX) 0.4 MG CAPS capsule Take 0.4 mg by mouth daily.    . Multiple Vitamins-Minerals (MULTIVITAMIN GUMMIES ADULT PO) Take 1 tablet by mouth daily.    . traMADol (ULTRAM) 50 MG tablet Take 1 tablet (50 mg total) by mouth every 6 (six) hours as needed for moderate pain. 10 tablet 0   No facility-administered medications prior to visit.  Objective:     BP 138/70 (BP Location: Left Arm, Cuff Size: Normal)   Pulse 76   Temp (!) 97.3 F (36.3 C) (Temporal)   Ht 5\' 7"  (1.702 m)   Wt 182 lb (82.6 kg)   SpO2 99% Comment: on RA  BMI 28.51 kg/m   SpO2: 99 % (on RA)   Pleasant amb wm nad    HEENT : pt wearing mask not removed for exam due to covid -19 concerns.    NECK :  without JVD/Nodes/TM/ nl carotid upstrokes bilaterally   LUNGS: no acc muscle use,  Nl contour chest which is clear to A and P bilaterally without cough on insp or exp maneuvers   CV:  RRR  no s3 or murmur or increase in P2, and no edema   ABD:   soft and nontender with nl inspiratory excursion in the supine position. No bruits or organomegaly appreciated, bowel sounds nl  MS:  Nl gait/ ext warm without deformities, calf tenderness, cyanosis or clubbing No obvious joint restrictions   SKIN: warm and dry without lesions    NEURO:  alert, approp, nl sensorium with  no motor or cerebellar deficits apparent.     I personally reviewed images and agree with radiology impression as follows:   Chest CT cuts on neck scan 09/09/19  12 mm spiculated and suspicious lung nodule within the left lung apex.         Assessment   Solitary pulmonary nodule on lung CT Active smoker - incidental from neck CT 09/09/19  LUL spiculated x  47mm  - PET ordered 01/01/2020   CT results reviewed with pt >>>  Good candidate for PET and excisional bx if stage I by PET and if not can refer for navigatinal bx/ ebus   In meantime needs PFTs > ordered today but suspect they'll be fine by clinical eval today.      Cigarette smoker Counseled re importance of smoking cessation but did not meet time criteria for separate billing    Also rec: Pt informed of the seriousness of COVID 19 infection as a direct risk to lung health  and safey and to close contacts and should continue to wear a facemask in public and minimize exposure to public locations but especially avoid any area or activity where non-close contacts are not observing distancing or wearing an appropriate face mask.  I strongly recommended she take either of the vaccines available through local drugstores based on updated information on millions of Americans treated with the Moderna and 01/03/2020 products  which have proven both safe and  effective even against the new delta variant.      Each maintenance medication was reviewed in detail including emphasizing most importantly the difference between maintenance and prns and under what circumstances the prns are to be triggered using an action plan  format where appropriate.  Total time for H and P, chart review, counseling,   and generating customized AVS unique to this office visit / charting = 55 min           [employer], MD 01/01/2020

## 2020-01-20 ENCOUNTER — Ambulatory Visit (HOSPITAL_COMMUNITY)
Admission: RE | Admit: 2020-01-20 | Discharge: 2020-01-20 | Disposition: A | Discharge status: Home / Self Care | Payer: Medicare HMO | Source: Ambulatory Visit | Attending: Internal Medicine | Admitting: Internal Medicine

## 2020-01-20 ENCOUNTER — Other Ambulatory Visit: Payer: Self-pay

## 2020-01-20 ENCOUNTER — Encounter (HOSPITAL_COMMUNITY): Payer: Self-pay

## 2020-01-20 DIAGNOSIS — R911 Solitary pulmonary nodule: Secondary | ICD-10-CM

## 2020-01-28 DIAGNOSIS — J42 Unspecified chronic bronchitis: Secondary | ICD-10-CM | POA: Diagnosis not present

## 2020-01-28 DIAGNOSIS — Z6828 Body mass index (BMI) 28.0-28.9, adult: Secondary | ICD-10-CM | POA: Diagnosis not present

## 2020-02-17 ENCOUNTER — Ambulatory Visit (HOSPITAL_COMMUNITY): Admission: RE | Admit: 2020-02-17 | Payer: Medicare HMO | Source: Ambulatory Visit

## 2020-02-17 ENCOUNTER — Other Ambulatory Visit: Payer: Self-pay

## 2020-02-17 ENCOUNTER — Ambulatory Visit (HOSPITAL_COMMUNITY)
Admission: RE | Admit: 2020-02-17 | Discharge: 2020-02-17 | Disposition: A | Discharge status: Home / Self Care | Payer: Medicare HMO | Source: Ambulatory Visit | Attending: Internal Medicine | Admitting: Internal Medicine

## 2020-02-17 DIAGNOSIS — R911 Solitary pulmonary nodule: Secondary | ICD-10-CM | POA: Insufficient documentation

## 2020-02-17 DIAGNOSIS — J439 Emphysema, unspecified: Secondary | ICD-10-CM | POA: Insufficient documentation

## 2020-02-17 DIAGNOSIS — J8410 Pulmonary fibrosis, unspecified: Secondary | ICD-10-CM | POA: Insufficient documentation

## 2020-02-18 ENCOUNTER — Other Ambulatory Visit: Payer: Medicare HMO | Admitting: Internal Medicine

## 2020-02-18 DIAGNOSIS — R911 Solitary pulmonary nodule: Secondary | ICD-10-CM | POA: Diagnosis not present

## 2020-02-18 DIAGNOSIS — Q383 Other congenital malformations of tongue: Secondary | ICD-10-CM

## 2020-02-18 MED ORDER — FLUDEOXYGLUCOSE F - 18 (FDG) INJECTION
12.3900 | Freq: Once | INTRAVENOUS | Status: AC | PRN
  Administered 2020-02-17: 12.39 via INTRAVENOUS

## 2020-02-18 NOTE — Progress Notes (Signed)
Spoke with pt and notified of results per Dr. Sherene Sires. Pt verbalized understanding and denied any questions.  Pt was agreeable to ENT referral as well as CT in 6 months and these orders were placed.

## 2020-04-02 DIAGNOSIS — N400 Enlarged prostate without lower urinary tract symptoms: Secondary | ICD-10-CM | POA: Diagnosis not present

## 2020-04-02 DIAGNOSIS — I10 Essential (primary) hypertension: Secondary | ICD-10-CM | POA: Diagnosis not present

## 2020-04-02 DIAGNOSIS — I7389 Other specified peripheral vascular diseases: Secondary | ICD-10-CM | POA: Diagnosis not present

## 2020-04-02 DIAGNOSIS — E782 Mixed hyperlipidemia: Secondary | ICD-10-CM | POA: Diagnosis not present

## 2020-04-02 DIAGNOSIS — Z6828 Body mass index (BMI) 28.0-28.9, adult: Secondary | ICD-10-CM | POA: Diagnosis not present

## 2020-04-03 ENCOUNTER — Encounter: Payer: Self-pay | Admitting: Cardiology

## 2020-04-03 ENCOUNTER — Ambulatory Visit (INDEPENDENT_AMBULATORY_CARE_PROVIDER_SITE_OTHER): Payer: Medicare HMO | Admitting: Cardiology

## 2020-04-03 ENCOUNTER — Encounter: Payer: Self-pay | Admitting: *Deleted

## 2020-04-03 ENCOUNTER — Other Ambulatory Visit: Payer: Self-pay

## 2020-04-03 VITALS — BP 96/50 | HR 70 | Ht 67.0 in | Wt 179.6 lb

## 2020-04-03 DIAGNOSIS — E782 Mixed hyperlipidemia: Secondary | ICD-10-CM | POA: Diagnosis not present

## 2020-04-03 DIAGNOSIS — I2510 Atherosclerotic heart disease of native coronary artery without angina pectoris: Secondary | ICD-10-CM | POA: Diagnosis not present

## 2020-04-03 DIAGNOSIS — I739 Peripheral vascular disease, unspecified: Secondary | ICD-10-CM | POA: Diagnosis not present

## 2020-04-03 MED ORDER — LOVASTATIN 40 MG PO TABS: 40.0000 mg | ORAL_TABLET | Freq: Every day | ORAL | 2 refills | Status: AC

## 2020-04-03 NOTE — Patient Instructions (Addendum)
Medication Instructions:   Your physician has recommended you make the following change in your medication:   Increase lovastatin to 40 mg daily  Continue other medications there same  Labwork:  none  Testing/Procedures:  none  Follow-Up:  Your physician recommends that you schedule a follow-up appointment in: 6 months.  Any Other Special Instructions Will Be Listed Below (If Applicable).  If you need a refill on your cardiac medications before your next appointment, please call your pharmacy.

## 2020-04-03 NOTE — Progress Notes (Signed)
Clinical Summary Terry Fitzgerald is a 85 y.o.male  1. CAD - history of prior CABG - cath 12/2010": mild diffuse LM disease, occluded LCX, occluded RCA.  Patent LIMA-LAD patent, patent SVG-OM, patent SVG-PDA,  - he report some stents after bypass 8-10 years ago  - no recent chest pain. No SOB/DOE - compliant with meds. - has been on ASA 325 and plavix for long time.  - uses push mower x 1 hour without troubles, including uphill.   - no recent chest pain. No SOB/DOE - complant with meds   2. PAD - prior left common iliac stenting. Prior left SFA angioplasty.  - followed by vascular   3. Carotid stenosis - previous bilateral CEAs as well as prior stenting -10/2019 right carotid stenting. - compliant with meds   4. Hyperlipidemia - he is unsure if has ever been on other statins, has been on lovastain.  - reports most recent labs from pcp  Past Medical History:  Diagnosis Date  . BPH (benign prostatic hyperplasia)   . Gout, unspecified    history x1  . Hypertension   . Myocardial infarction [facility]) 2011     Allergies  Allergen Reactions  . Drug Class [Morphine And Related] Other (See Comments)    Hallucinations.     Current Outpatient Medications  Medication Sig Dispense Refill  . aspirin 325 MG tablet Take 325 mg by mouth every evening.    . clopidogrel (PLAVIX) 75 MG tablet Take 75 mg by mouth daily.    . finasteride (PROSCAR) 5 MG tablet Take 5 mg by mouth every evening.    Marland Kitchen losartan (COZAAR) 100 MG tablet Take 100 mg by mouth daily.    Marland Kitchen lovastatin (MEVACOR) 20 MG tablet Take 20 mg by mouth daily.    . pantoprazole (PROTONIX) 40 MG tablet Take 40 mg by mouth daily.    . tamsulosin (FLOMAX) 0.4 MG CAPS capsule Take 0.4 mg by mouth daily.     No current facility-administered medications for this visit.     Past Surgical History:  Procedure Laterality Date  . APPENDECTOMY    . CATARACT EXTRACTION W/PHACO Right 05/27/2019   Procedure: CATARACT  EXTRACTION PHACO AND INTRAOCULAR LENS PLACEMENT (IOC);  Surgeon: Fabio Pierce, MD;  Location: [facility];  Service: Ophthalmology;  Laterality: Right;  CDE: 15.37  . CATARACT EXTRACTION W/PHACO Left 06/14/2019   Procedure: CATARACT EXTRACTION PHACO AND INTRAOCULAR LENS PLACEMENT LEFT EYE;  Surgeon: Fabio Pierce, MD;  Location: [facility];  Service: Ophthalmology;  Laterality: Left;  CDE: 20.79  . CORONARY ARTERY BYPASS GRAFT     triple bypass 2011  . HERNIA REPAIR     Rih  . TONSILLECTOMY    . TRANSCAROTID ARTERY REVASCULARIZATION Right 10/25/2019   Procedure: RIGHT TRANSCAROTID ARTERY REVASCULARIZATION;  Surgeon: Cephus Shelling, MD;  Location: [facility] OR;  Service: Vascular;  Laterality: Right;  . ULTRASOUND GUIDANCE FOR VASCULAR ACCESS Left 10/25/2019   Procedure: ULTRASOUND GUIDANCE FOR VASCULAR ACCESS;  Surgeon: Cephus Shelling, MD;  Location: [facility] OR;  Service: Vascular;  Laterality: Left;     Allergies  Allergen Reactions  . Drug Class [Morphine And Related] Other (See Comments)    Hallucinations.      No family history on file.   Social History Terry Fitzgerald reports that he has been smoking cigarettes. He has a 32.50 pack-year smoking history. He has never used smokeless tobacco. Terry Fitzgerald reports previous alcohol use.   Review of Systems CONSTITUTIONAL: No weight  loss, fever, chills, weakness or fatigue.  HEENT: Eyes: No visual loss, blurred vision, double vision or yellow sclerae.No hearing loss, sneezing, congestion, runny nose or sore throat.  SKIN: No rash or itching.  CARDIOVASCULAR: per hpi RESPIRATORY: No shortness of breath, cough or sputum.  GASTROINTESTINAL: No anorexia, nausea, vomiting or diarrhea. No abdominal pain or blood.  GENITOURINARY: No burning on urination, no polyuria NEUROLOGICAL: No headache, dizziness, syncope, paralysis, ataxia, numbness or tingling in the extremities. No change in bowel or bladder control.  MUSCULOSKELETAL: No muscle, back  pain, joint pain or stiffness.  LYMPHATICS: No enlarged nodes. No history of splenectomy.  PSYCHIATRIC: No history of depression or anxiety.  ENDOCRINOLOGIC: No reports of sweating, cold or heat intolerance. No polyuria or polydipsia.  Marland Kitchen   Physical Examination Today's Vitals   04/03/20 0826 04/03/20 0828  BP: (!) 94/50 (!) 96/50  Pulse: 70   SpO2: 98%   Weight: 179 lb 9.6 oz (81.5 kg)   Height: 5\' 7"  (1.702 m)    Body mass index is 28.13 kg/m.  Gen: resting comfortably, no acute distress HEENT: no scleral icterus, pupils equal round and reactive, no palptable cervical adenopathy,  CV: RRR, no mr/g, no jvd Resp: Clear to auscultation bilaterally GI: abdomen is soft, non-tender, non-distended, normal bowel sounds, no hepatosplenomegaly MSK: extremities are warm, no edema.  Skin: warm, no rash Neuro:  no focal deficits Psych: appropriate affect   Diagnostic Studies     Assessment and Plan  1. CAD - high dose ASA and plavix more for his extesnive PAD history as opposed for his CAD, reasonable to continue - no cardiac symptoms, continue current meds  2. Hyperlipidemia - fairly mild statin, he is unsure if has been on more potent statins in the past -will request labs from pcp, increase lovastatin to 40mg  daily. In general try to get him to highest tolerated statin dose, LDL <70  3. PAD -continue to follow with vascular - continue medical therapy  F/u 6 months     , M.D.

## 2020-06-13 DIAGNOSIS — N400 Enlarged prostate without lower urinary tract symptoms: Secondary | ICD-10-CM | POA: Diagnosis not present

## 2020-06-13 DIAGNOSIS — E782 Mixed hyperlipidemia: Secondary | ICD-10-CM | POA: Diagnosis not present

## 2020-06-13 DIAGNOSIS — I10 Essential (primary) hypertension: Secondary | ICD-10-CM | POA: Diagnosis not present

## 2020-07-02 DIAGNOSIS — Z6828 Body mass index (BMI) 28.0-28.9, adult: Secondary | ICD-10-CM | POA: Diagnosis not present

## 2020-07-02 DIAGNOSIS — I10 Essential (primary) hypertension: Secondary | ICD-10-CM | POA: Diagnosis not present

## 2020-07-02 DIAGNOSIS — I7389 Other specified peripheral vascular diseases: Secondary | ICD-10-CM | POA: Diagnosis not present

## 2020-07-02 DIAGNOSIS — N400 Enlarged prostate without lower urinary tract symptoms: Secondary | ICD-10-CM | POA: Diagnosis not present

## 2020-07-02 DIAGNOSIS — E782 Mixed hyperlipidemia: Secondary | ICD-10-CM | POA: Diagnosis not present

## 2020-07-15 ENCOUNTER — Other Ambulatory Visit: Payer: Self-pay

## 2020-07-15 ENCOUNTER — Ambulatory Visit
Admission: RE | Admit: 2020-07-15 | Discharge: 2020-07-15 | Disposition: A | Discharge status: Home / Self Care | Payer: Medicare HMO | Source: Ambulatory Visit | Attending: Internal Medicine | Admitting: Internal Medicine

## 2020-07-15 DIAGNOSIS — R918 Other nonspecific abnormal finding of lung field: Secondary | ICD-10-CM | POA: Diagnosis not present

## 2020-07-15 DIAGNOSIS — R059 Cough, unspecified: Secondary | ICD-10-CM | POA: Diagnosis not present

## 2020-07-15 DIAGNOSIS — I2510 Atherosclerotic heart disease of native coronary artery without angina pectoris: Secondary | ICD-10-CM | POA: Diagnosis not present

## 2020-07-15 DIAGNOSIS — R911 Solitary pulmonary nodule: Secondary | ICD-10-CM

## 2020-07-15 DIAGNOSIS — I700 Atherosclerosis of aorta: Secondary | ICD-10-CM | POA: Diagnosis not present

## 2020-08-14 DIAGNOSIS — R051 Acute cough: Secondary | ICD-10-CM | POA: Diagnosis not present

## 2020-09-01 ENCOUNTER — Ambulatory Visit: Payer: Medicare HMO | Admitting: Urology

## 2020-09-23 DIAGNOSIS — Z7902 Long term (current) use of antithrombotics/antiplatelets: Secondary | ICD-10-CM | POA: Diagnosis not present

## 2020-09-23 DIAGNOSIS — I2510 Atherosclerotic heart disease of native coronary artery without angina pectoris: Secondary | ICD-10-CM | POA: Diagnosis not present

## 2020-09-23 DIAGNOSIS — I252 Old myocardial infarction: Secondary | ICD-10-CM | POA: Diagnosis not present

## 2020-09-23 DIAGNOSIS — K08109 Complete loss of teeth, unspecified cause, unspecified class: Secondary | ICD-10-CM | POA: Diagnosis not present

## 2020-09-23 DIAGNOSIS — I10 Essential (primary) hypertension: Secondary | ICD-10-CM | POA: Diagnosis not present

## 2020-09-23 DIAGNOSIS — E785 Hyperlipidemia, unspecified: Secondary | ICD-10-CM | POA: Diagnosis not present

## 2020-09-23 DIAGNOSIS — Z809 Family history of malignant neoplasm, unspecified: Secondary | ICD-10-CM | POA: Diagnosis not present

## 2020-09-23 DIAGNOSIS — N400 Enlarged prostate without lower urinary tract symptoms: Secondary | ICD-10-CM | POA: Diagnosis not present

## 2020-09-23 DIAGNOSIS — R69 Illness, unspecified: Secondary | ICD-10-CM | POA: Diagnosis not present

## 2020-09-23 DIAGNOSIS — Z7982 Long term (current) use of aspirin: Secondary | ICD-10-CM | POA: Diagnosis not present

## 2020-10-14 ENCOUNTER — Ambulatory Visit: Payer: Medicare HMO | Admitting: Cardiology

## 2020-10-14 NOTE — Progress Notes (Deleted)
Clinical Summary Terry Fitzgerald is a 85 y.o.male   1. CAD - history of prior CABG - cath 12/2010": mild diffuse LM disease, occluded LCX, occluded RCA.  Patent LIMA-LAD patent, patent SVG-OM, patent SVG-PDA,  - he report some stents after bypass 8-10 years ago   - no recent chest pain. No SOB/DOE - compliant with meds. - has been on ASA 325 and plavix for long time.  - uses push mower x 1 hour without troubles, including uphill.    - no recent chest pain. No SOB/DOE - complant with meds     2. PAD - prior left common iliac stenting. Prior left SFA angioplasty.  - followed by vascular     3. Carotid stenosis - previous bilateral CEAs as well as prior stenting -10/2019 right carotid stenting. - compliant with meds     4. Hyperlipidemia - he is unsure if has ever been on other statins, has been on lovastain.  - reports most recent labs from pcp   Past Medical History:  Diagnosis Date   BPH (benign prostatic hyperplasia)    Gout, unspecified    history x1   Hypertension    Myocardial infarction [facility]) 2011     Allergies  Allergen Reactions   Drug Class [Morphine And Related] Other (See Comments)    Hallucinations.     Current Outpatient Medications  Medication Sig Dispense Refill   aspirin 325 MG tablet Take 325 mg by mouth every evening.     clopidogrel (PLAVIX) 75 MG tablet Take 75 mg by mouth daily.     finasteride (PROSCAR) 5 MG tablet Take 5 mg by mouth every evening.     losartan (COZAAR) 100 MG tablet Take 100 mg by mouth daily.     lovastatin (MEVACOR) 40 MG tablet Take 1 tablet (40 mg total) by mouth at bedtime. 90 tablet 2   pantoprazole (PROTONIX) 40 MG tablet Take 40 mg by mouth daily.     tamsulosin (FLOMAX) 0.4 MG CAPS capsule Take 0.4 mg by mouth daily.     No current facility-administered medications for this visit.     Past Surgical History:  Procedure Laterality Date   APPENDECTOMY     CATARACT EXTRACTION W/PHACO Right 05/27/2019    Procedure: CATARACT EXTRACTION PHACO AND INTRAOCULAR LENS PLACEMENT (IOC);  Surgeon: Fabio Pierce, MD;  Location: [facility];  Service: Ophthalmology;  Laterality: Right;  CDE: 15.37   CATARACT EXTRACTION W/PHACO Left 06/14/2019   Procedure: CATARACT EXTRACTION PHACO AND INTRAOCULAR LENS PLACEMENT LEFT EYE;  Surgeon: Fabio Pierce, MD;  Location: [facility];  Service: Ophthalmology;  Laterality: Left;  CDE: 20.79   CORONARY ARTERY BYPASS GRAFT     triple bypass 2011   HERNIA REPAIR     Rih   TONSILLECTOMY     TRANSCAROTID ARTERY REVASCULARIZATION  Right 10/25/2019   Procedure: RIGHT TRANSCAROTID ARTERY REVASCULARIZATION;  Surgeon: Cephus Shelling, MD;  Location: [facility] OR;  Service: Vascular;  Laterality: Right;   ULTRASOUND GUIDANCE FOR VASCULAR ACCESS Left 10/25/2019   Procedure: ULTRASOUND GUIDANCE FOR VASCULAR ACCESS;  Surgeon: Cephus Shelling, MD;  Location: [facility] OR;  Service: Vascular;  Laterality: Left;     Allergies  Allergen Reactions   Drug Class [Morphine And Related] Other (See Comments)    Hallucinations.      No family history on file.   Social History Terry Fitzgerald reports that he has been smoking cigarettes. He has a 32.50 pack-year smoking history. He has never used  smokeless tobacco. Terry Fitzgerald reports that he does not currently use alcohol.   Review of Systems CONSTITUTIONAL: No weight loss, fever, chills, weakness or fatigue.  HEENT: Eyes: No visual loss, blurred vision, double vision or yellow sclerae.No hearing loss, sneezing, congestion, runny nose or sore throat.  SKIN: No rash or itching.  CARDIOVASCULAR:  RESPIRATORY: No shortness of breath, cough or sputum.  GASTROINTESTINAL: No anorexia, nausea, vomiting or diarrhea. No abdominal pain or blood.  GENITOURINARY: No burning on urination, no polyuria NEUROLOGICAL: No headache, dizziness, syncope, paralysis, ataxia, numbness or tingling in the extremities. No change in bowel or bladder control.   MUSCULOSKELETAL: No muscle, back pain, joint pain or stiffness.  LYMPHATICS: No enlarged nodes. No history of splenectomy.  PSYCHIATRIC: No history of depression or anxiety.  ENDOCRINOLOGIC: No reports of sweating, cold or heat intolerance. No polyuria or polydipsia.  Marland Kitchen   Physical Examination There were no vitals filed for this visit. There were no vitals filed for this visit.  Gen: resting comfortably, no acute distress HEENT: no scleral icterus, pupils equal round and reactive, no palptable cervical adenopathy,  CV Resp: Clear to auscultation bilaterally GI: abdomen is soft, non-tender, non-distended, normal bowel sounds, no hepatosplenomegaly MSK: extremities are warm, no edema.  Skin: warm, no rash Neuro:  no focal deficits Psych: appropriate affect   Diagnostic Studies     Assessment and Plan  1. CAD - high dose ASA and plavix more for his extesnive PAD history as opposed for his CAD, reasonable to continue - no cardiac symptoms, continue current meds   2. Hyperlipidemia - fairly mild statin, he is unsure if has been on more potent statins in the past -will request labs from pcp, increase lovastatin to 40mg  daily. In general try to get him to highest tolerated statin dose, LDL <70   3. PAD -continue to follow with vascular - continue medical therapy      , M.D., F.A.C.C.

## 2020-10-20 DIAGNOSIS — Z0000 Encounter for general adult medical examination without abnormal findings: Secondary | ICD-10-CM | POA: Diagnosis not present

## 2020-10-20 DIAGNOSIS — I10 Essential (primary) hypertension: Secondary | ICD-10-CM | POA: Diagnosis not present

## 2020-10-20 DIAGNOSIS — N400 Enlarged prostate without lower urinary tract symptoms: Secondary | ICD-10-CM | POA: Diagnosis not present

## 2020-10-20 DIAGNOSIS — Z6827 Body mass index (BMI) 27.0-27.9, adult: Secondary | ICD-10-CM | POA: Diagnosis not present

## 2020-10-20 DIAGNOSIS — Z1331 Encounter for screening for depression: Secondary | ICD-10-CM | POA: Diagnosis not present

## 2020-10-20 DIAGNOSIS — E782 Mixed hyperlipidemia: Secondary | ICD-10-CM | POA: Diagnosis not present

## 2020-10-20 DIAGNOSIS — I7389 Other specified peripheral vascular diseases: Secondary | ICD-10-CM | POA: Diagnosis not present

## 2021-01-25 DIAGNOSIS — E782 Mixed hyperlipidemia: Secondary | ICD-10-CM | POA: Diagnosis not present

## 2021-01-25 DIAGNOSIS — Z0000 Encounter for general adult medical examination without abnormal findings: Secondary | ICD-10-CM | POA: Diagnosis not present

## 2021-01-25 DIAGNOSIS — Z6827 Body mass index (BMI) 27.0-27.9, adult: Secondary | ICD-10-CM | POA: Diagnosis not present

## 2021-01-25 DIAGNOSIS — N400 Enlarged prostate without lower urinary tract symptoms: Secondary | ICD-10-CM | POA: Diagnosis not present

## 2021-01-25 DIAGNOSIS — I10 Essential (primary) hypertension: Secondary | ICD-10-CM | POA: Diagnosis not present

## 2021-01-25 DIAGNOSIS — I7389 Other specified peripheral vascular diseases: Secondary | ICD-10-CM | POA: Diagnosis not present

## 2021-01-25 DIAGNOSIS — Z1331 Encounter for screening for depression: Secondary | ICD-10-CM | POA: Diagnosis not present

## 2021-04-26 DIAGNOSIS — E782 Mixed hyperlipidemia: Secondary | ICD-10-CM | POA: Diagnosis not present

## 2021-04-26 DIAGNOSIS — I10 Essential (primary) hypertension: Secondary | ICD-10-CM | POA: Diagnosis not present

## 2021-04-26 DIAGNOSIS — I7389 Other specified peripheral vascular diseases: Secondary | ICD-10-CM | POA: Diagnosis not present

## 2021-06-30 DIAGNOSIS — J40 Bronchitis, not specified as acute or chronic: Secondary | ICD-10-CM | POA: Diagnosis not present

## 2021-07-07 ENCOUNTER — Other Ambulatory Visit: Payer: Self-pay | Admitting: Internal Medicine

## 2021-07-07 DIAGNOSIS — R911 Solitary pulmonary nodule: Secondary | ICD-10-CM

## 2021-07-27 ENCOUNTER — Ambulatory Visit (HOSPITAL_COMMUNITY)
Admission: RE | Admit: 2021-07-27 | Discharge: 2021-07-27 | Disposition: A | Discharge status: Home / Self Care | Payer: Medicare Other | Source: Ambulatory Visit | Attending: Internal Medicine | Admitting: Internal Medicine

## 2021-07-27 DIAGNOSIS — R911 Solitary pulmonary nodule: Secondary | ICD-10-CM | POA: Insufficient documentation

## 2021-07-27 DIAGNOSIS — J439 Emphysema, unspecified: Secondary | ICD-10-CM | POA: Diagnosis not present

## 2021-07-29 ENCOUNTER — Ambulatory Visit: Payer: Medicare Other | Admitting: Family Medicine

## 2021-08-13 ENCOUNTER — Ambulatory Visit: Payer: Medicare Other | Admitting: Internal Medicine

## 2021-08-29 DIAGNOSIS — L03211 Cellulitis of face: Secondary | ICD-10-CM | POA: Diagnosis not present

## 2021-10-06 ENCOUNTER — Ambulatory Visit: Payer: Medicare Other | Admitting: Internal Medicine

## 2021-10-12 ENCOUNTER — Ambulatory Visit: Payer: Medicare Other | Admitting: Internal Medicine

## 2021-10-12 DIAGNOSIS — J4489 Other specified chronic obstructive pulmonary disease: Secondary | ICD-10-CM | POA: Insufficient documentation

## 2021-10-12 DIAGNOSIS — J449 Chronic obstructive pulmonary disease, unspecified: Secondary | ICD-10-CM | POA: Insufficient documentation

## 2021-10-12 NOTE — Progress Notes (Deleted)
   Terry Fitzgerald, male    DOB: 1935-08-04, 86 y.o.   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 Sherald Hess for eval of pulmonary nodule noted on CT Angio neck performed 09/09/2019.  He states has occ cough with clear sputum.    Dyspnea: push mower / rake leaves/ [employer] walking  Cough: minimal smoker's hack x 3-4 x and good for the day/ min mucoid Sleep: able to lie flat / one pillow SABA use: none  Rec     10/12/2021  f/u ov/ office/Terry Fitzgerald re: *** maint on ***  No chief complaint on file.   Dyspnea:  *** Cough: *** Sleeping: *** SABA use: *** 02: *** Covid status: *** Lung cancer screening: ***   No obvious day to day or daytime variability or assoc excess/ purulent sputum or mucus plugs or hemoptysis or cp or chest tightness, subjective wheeze or overt sinus or hb symptoms.   *** without nocturnal  or early am exacerbation  of respiratory  c/o's or need for noct saba. Also denies any obvious fluctuation of symptoms with weather or environmental changes or other aggravating or alleviating factors except as outlined above   No unusual exposure hx or h/o childhood pna/ asthma or knowledge of premature birth.  Current Allergies, Complete Past Medical History, Past Surgical History, Family History, and Social History were reviewed in [employer] record.  ROS  The following are not active complaints unless bolded Hoarseness, sore throat, dysphagia, dental problems, itching, sneezing,  nasal congestion or discharge of excess mucus or  purulent secretions, ear ache,   fever, chills, sweats, unintended wt loss or wt gain, classically pleuritic or exertional cp,  orthopnea pnd or arm/hand swelling  or leg swelling, presyncope, palpitations, abdominal pain, anorexia, nausea, vomiting, diarrhea  or change in bowel habits or change in bladder habits, change in stools or change in urine, dysuria, hematuria,  rash, arthralgias, visual complaints, headache, numbness, weakness or ataxia or problems with walking or coordination,  change in mood or  memory.        No outpatient medications have been marked as taking for the 10/12/21 encounter (Appointment) with Nyoka Cowden, MD.           Objective:    Wt Readings from Last 3 Encounters:  04/03/20 179 lb 9.6 oz (81.5 kg)  01/01/20 182 lb (82.6 kg)  10/25/19 179 lb 3.7 oz (81.3 kg)      Vital signs reviewed  10/12/2021  - Note at rest 02 sats  ***% on ***   General appearance:    ***                  Assessment

## 2021-10-14 IMAGING — CT CT ANGIO NECK
2 of 3 series · 9 of 32 positions shown, 14 images · IV contrast (APPLIED)
Comparison: No pertinent prior exams are available for comparison.

CLINICAL DATA: Bilateral carotid artery stenosis. Carotid artery
stenosis; carotid artery stenosis screening, no risk factors.

Creatinine was obtained on site at [HOSPITAL] at [HOSPITAL].
Results: Creatinine 1 point mg/dL (GFR 56).
EXAM:
CT ANGIOGRAPHY NECK
TECHNIQUE: Multidetector CT imaging of the neck was performed using the
standard protocol during bolus administration of intravenous
contrast. Multiplanar CT image reconstructions and MIPs were
obtained to evaluate the vascular anatomy. Carotid stenosis
measurements (when applicable) are obtained utilizing NASCET
criteria, using the distal internal carotid diameter as the
denominator.
CONTRAST:  75mL PRV009-PA6 IOPAMIDOL (PRV009-PA6) INJECTION 76%

[Series 5: carotid angio · axial · 0.49mm/px · z∈[-345,-131]mm · 7 of 143 slices shown, 12 images]
[im 18/143  soft-tissue]
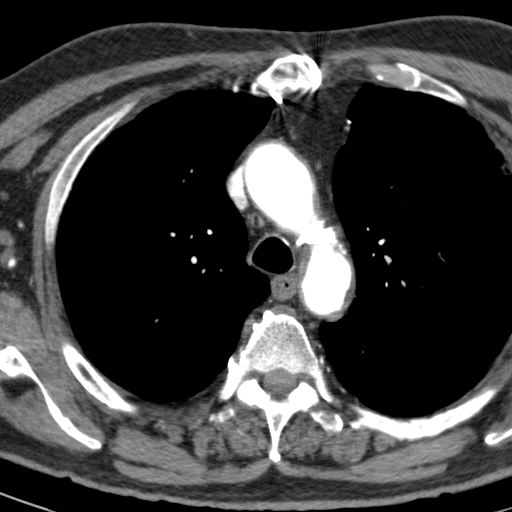
[im 18/143  bone]
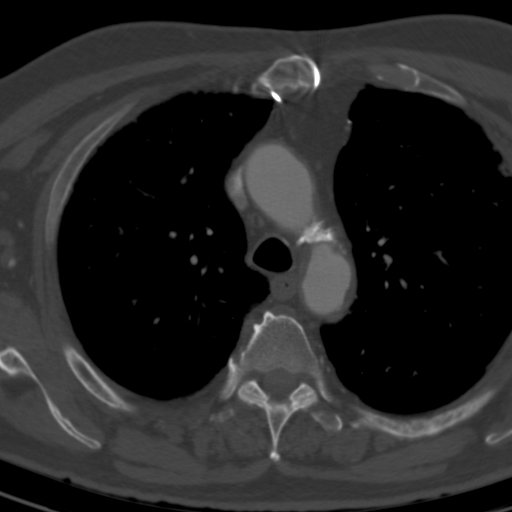
[im 36/143  soft-tissue]
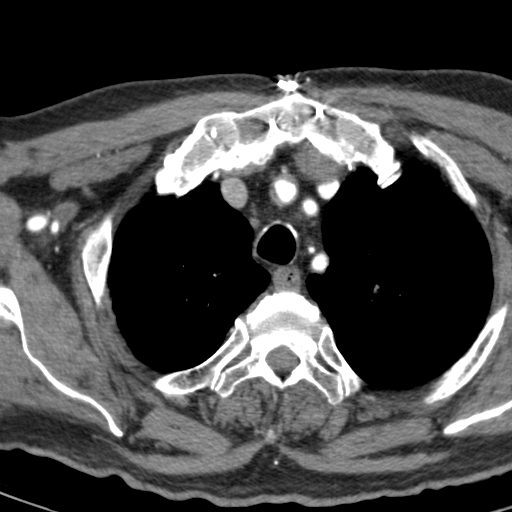
[im 54/143  soft-tissue]
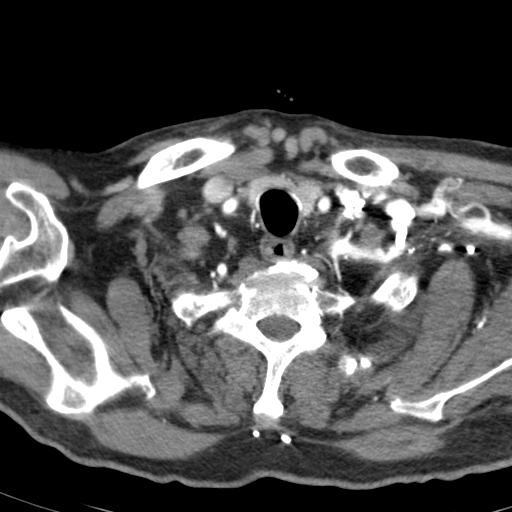
[im 72/143  soft-tissue]
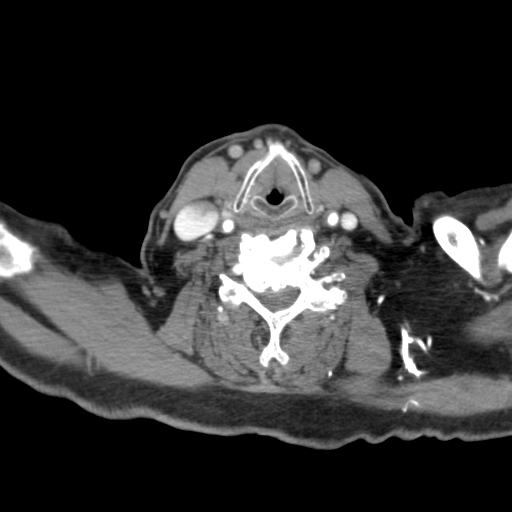
[im 72/143  lung]
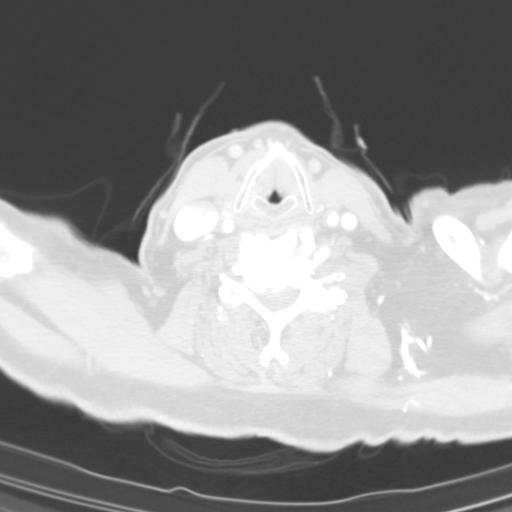
[im 89/143  soft-tissue]
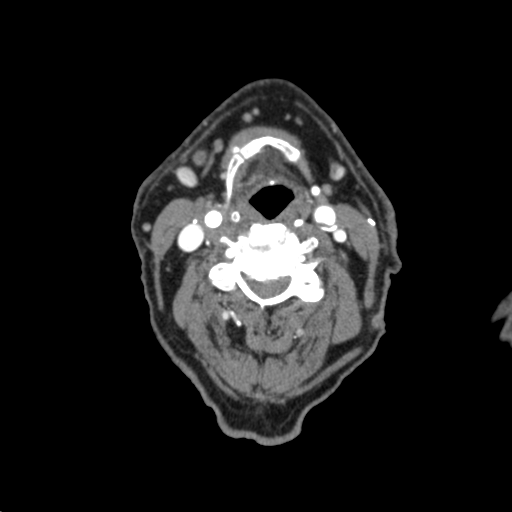
[im 89/143  lung]
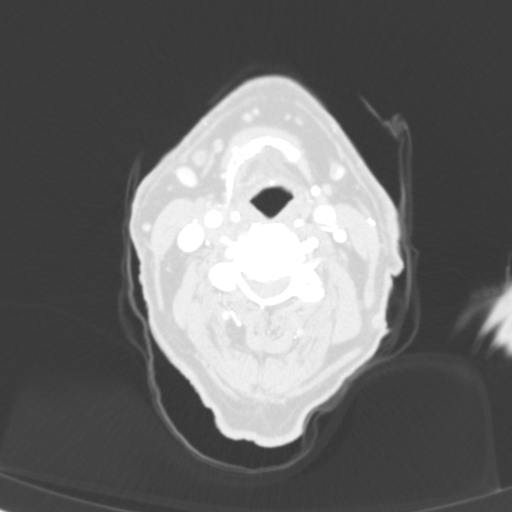
[im 107/143  soft-tissue]
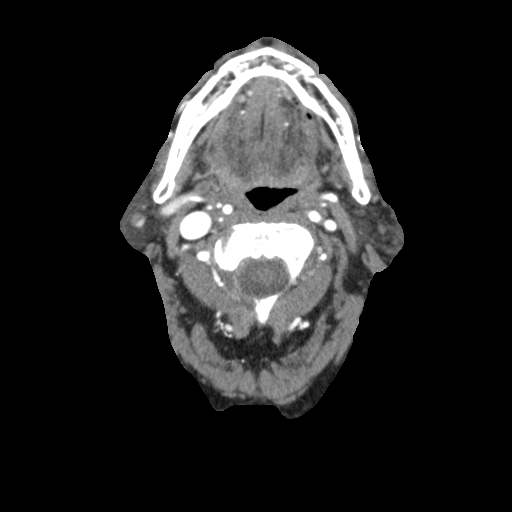
[im 107/143  lung]
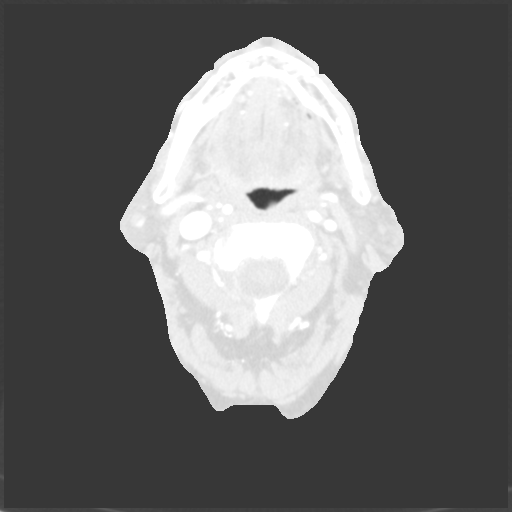
[im 125/143  soft-tissue]
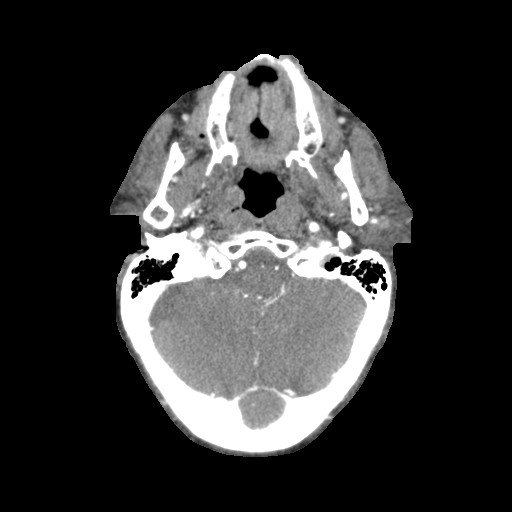
[im 125/143  lung]
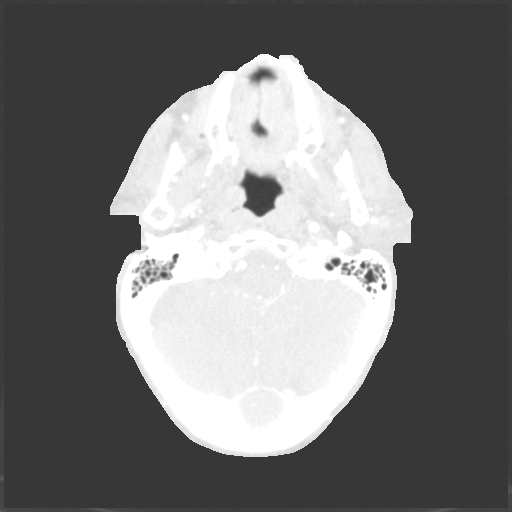

[Series 12: axial thick · axial · 0.48mm/px · z∈[-280,-190]mm · 2 of 54 slices shown]
[im 18/54  soft-tissue]
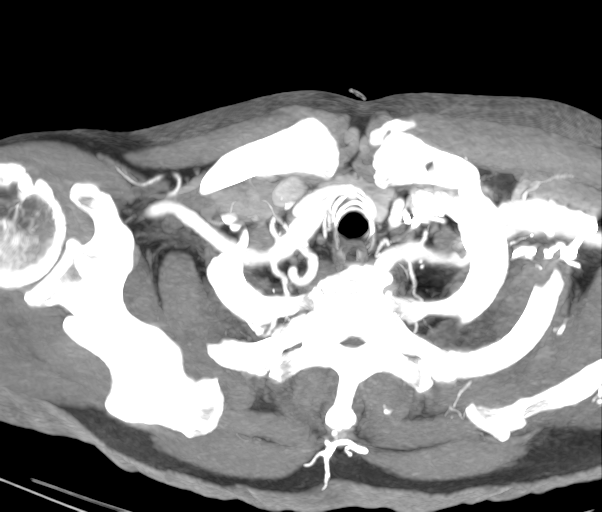
[im 36/54  soft-tissue]
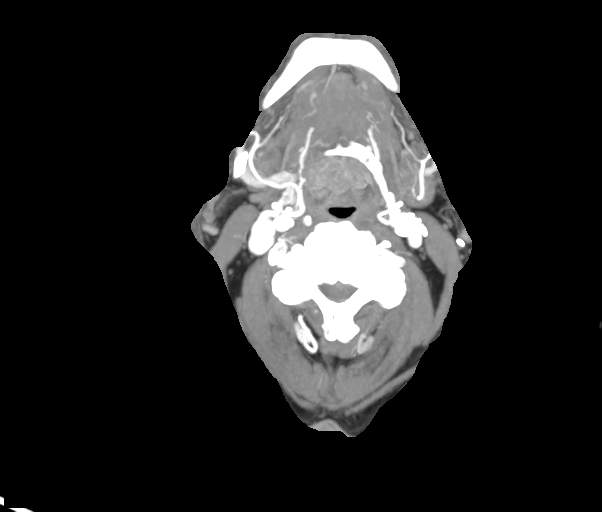

[9 of 32 positions shown; findings below may reference images not displayed]

FINDINGS: Aortic arch: The left vertebral artery arises directly from the
aortic arch. Mixed atherosclerotic plaque within the visualized
aortic arch and proximal major branch vessels of the neck. No
hemodynamically significant innominate or proximal subclavian artery
stenosis.

Right carotid system: CCA and ICA patent within the neck. A stent
extends from the mid/distal CCA into the proximal ICA. There is
stenosis within the proximal aspect of the stent with luminal
narrowing of 65-70% as compared to the normal caliber cervical ICA
more distally within the neck. There is also mild nonstenotic mixed
plaque within the distal cervical ICA.

Left carotid system: CCA and ICA patent within the neck without
significant stenosis (50% or greater). Mild to moderate mixed plaque
within the left carotid system within the neck, most notably at the
vessel origin, within the carotid bifurcation and proximal ICA.

Vertebral arteries: The vertebral arteries are patent within the
neck bilaterally. The right vertebral artery is dominant.
Moderate/severe atherosclerotic narrowing at the origin of the right
vertebral artery. Severe atherosclerotic narrowing at the origin of
the left vertebral artery. The non dominant left vertebral artery
appears to terminate predominantly as the left PICA with only
minimal contribution to the basilar artery.

Skeleton: No acute bony abnormality or aggressive osseous lesion.
Cervical spondylosis with multilevel disc space narrowing, posterior
disc osteophytes, uncovertebral and facet hypertrophy. There also
prominent multilevel ventral osteophytes, most notably within the
lower cervical spine.

Other neck: No neck mass or lymphadenopathy.Thyroid unremarkable.

Upper chest: 12 mm spiculated and suspicious lung nodule within the
left lung apex (series 7, image 153) (series 5, image 34). A
pretracheal lymph node measures at the upper limits of normal for
size in short axis (10 mm) (series 5, image 12).

Impressions #1 and #4 below will be called to the ordering clinician
or representative by the Radiologist Assistant, and communication
documented in the PACS or [REDACTED].
IMPRESSION: A stent extends from the mid/distal right common carotid artery into
the proximal right ICA. Soft plaque results in stenosis within the
proximal aspect of the stent with luminal narrowing of 65-70% as
compared to the normal caliber cervical ICA more distally within the
neck.

The left CCA and ICA are patent within the neck without
hemodynamically significant stenosis. Mild to moderate mixed plaque
within the left carotid system within the neck.

The vertebral arteries are patent within the neck bilaterally with
the right being dominant. Moderate/severe atherosclerotic narrowing
at the origin of the right vertebral artery. Severe atherosclerotic
narrowing at the origin of the left vertebral artery.

12 mm spiculated and suspicious lung nodule within the left lung
apex. Consider (a) 3 month chest CT follow-up, (b) follow-up PET-CT,
or (c) tissue sampling. This recommendation follows the consensus
statement: Guidelines for Management of Incidental Pulmonary Nodules
Detected on CT Images: From the [HOSPITAL] 8031; Radiology

## 2022-01-14 ENCOUNTER — Telehealth: Payer: Self-pay | Admitting: *Deleted

## 2022-01-14 DIAGNOSIS — R911 Solitary pulmonary nodule: Secondary | ICD-10-CM

## 2022-01-14 NOTE — Telephone Encounter (Signed)
Spoke with the pt and notified of response per Dr Wert He verbalized understanding  Nothing further needed 

## 2022-01-14 NOTE — Telephone Encounter (Signed)
-----   Message from Nyoka Cowden, MD sent at 07/29/2021  4:44 PM EDT ----- Needs ct s contrast by mid Dec 2023 f/u spn

## 2022-02-03 ENCOUNTER — Ambulatory Visit (HOSPITAL_COMMUNITY)
Admission: RE | Admit: 2022-02-03 | Discharge: 2022-02-03 | Disposition: A | Discharge status: Home / Self Care | Payer: Medicare Other | Source: Ambulatory Visit | Attending: Internal Medicine | Admitting: Internal Medicine

## 2022-02-03 DIAGNOSIS — R911 Solitary pulmonary nodule: Secondary | ICD-10-CM | POA: Insufficient documentation

## 2022-02-03 DIAGNOSIS — R918 Other nonspecific abnormal finding of lung field: Secondary | ICD-10-CM | POA: Diagnosis not present

## 2022-02-03 DIAGNOSIS — J439 Emphysema, unspecified: Secondary | ICD-10-CM | POA: Diagnosis not present

## 2022-02-08 ENCOUNTER — Telehealth: Payer: Self-pay | Admitting: Internal Medicine

## 2022-02-08 NOTE — Telephone Encounter (Signed)
Received call report from [facility] with [facility] on patient's CT Super D Chest done on 02/03/22. Dr. Sherene Sires, please review the result/impression copied below:  IMPRESSION: 1. Elongated, irregular nodule of the right upper lobe, which is predominantly oriented craniocaudally, measuring 2.0 x 0.7 x 0.6 cm. This previously measured 1.4 x 0.7 x 0.6 cm on examination dated 07/27/2021 and 0.7 x 0.4 x 0.3 cm on examination dated 07/15/2020. Continued enlargement over time is highly concerning for primary lung malignancy. 2. Mild emphysema and diffuse bilateral bronchial wall thickening. 3. Mild pulmonary fibrosis in a pattern with apical to basal gradient, featuring irregular peripheral interstitial opacity, septal thickening, and a preponderance of ground-glass of the bilateral lung bases, however without clear evidence of subpleural bronchiolectasis or honeycombing. Findings are suggestive of an alternative diagnosis (not UIP) per consensus guidelines: Diagnosis of Idiopathic Pulmonary Fibrosis: An Official ATS/ERS/JRS/ALAT Clinical Practice Guideline. Am Rosezetta Schlatter Crit Care Med Vol 198, Iss 5, [phone], Oct 15 2016. 4. Multiple bilateral exophytic renal lesions of varying attenuation, incompletely characterized by noncontrast CT of the chest. Consider dedicated imaging of the abdomen and pelvis with and without contrast 4 further evaluation. 5. Coronary artery disease.  Please advise, thank you.

## 2022-02-11 ENCOUNTER — Encounter: Payer: Self-pay | Admitting: Nurse Practitioner

## 2022-02-11 ENCOUNTER — Ambulatory Visit (INDEPENDENT_AMBULATORY_CARE_PROVIDER_SITE_OTHER): Payer: Medicare Other | Admitting: Nurse Practitioner

## 2022-02-11 VITALS — BP 112/60 | HR 69 | Ht 68.0 in | Wt 173.4 lb

## 2022-02-11 DIAGNOSIS — R911 Solitary pulmonary nodule: Secondary | ICD-10-CM

## 2022-02-11 DIAGNOSIS — F17210 Nicotine dependence, cigarettes, uncomplicated: Secondary | ICD-10-CM

## 2022-02-11 DIAGNOSIS — Z720 Tobacco use: Secondary | ICD-10-CM

## 2022-02-11 MED ORDER — NICOTINE 21 MG/24HR TD PT24: 21.0000 mg | MEDICATED_PATCH | Freq: Every day | TRANSDERMAL | 0 refills | Status: AC

## 2022-02-11 NOTE — Patient Instructions (Signed)
Your lung nodule has grown in size and is concerning for a possible lung cancer. I have sent a referral to cardiothoracic surgery to discuss next steps. Someone should contact you in the next week to schedule this.   You need to quit smoking Nicotine patches - apply one 21 mcg patch daily for 6 weeks then decrease to one 14 mcg patch daily for 2 weeks then one 7 mcg patch for 2 weeks.  Repeat pulmonary function testing ordered today - 30 min slot; next available   Follow up after PFT to discuss results with Dr. Sherene Sires or Philis Nettle. If symptoms worsen, please contact office for sooner follow up or seek emergency care.

## 2022-02-11 NOTE — Assessment & Plan Note (Signed)
The patient's current tobacco use: 1/2-2/4 ppd The patient was advised to quit and impact of smoking on their health.  I assessed the patient's willingness to attempt to quit. I provided methods and skills for cessation. We reviewed medication management of smoking session drugs if appropriate. Nicotine patches provided  Resources to help quit smoking were provided. A smoking cessation quit date was set: 02/14/2022 Follow-up was arranged in our clinic.  The amount of time spent counseling patient was 5 mins

## 2022-02-11 NOTE — Assessment & Plan Note (Signed)
Increasing RUL nodule, suspicious for malignancy. Reviewed potential next steps including repeat PET scan vs referral to thoracic surgery for possible biopsy/surgical resection. He opted to see thoracic surgery first - referral placed today. He is fairly active and previous lung function was normal so suspect he would be a candidate for surgical intervention, should he quit smoking. I have counseled him on the importance of this. We will obtain repeat spirometry/DLCO prior to his appt with CT surgery given his last set of PFTs were from 2021.   Patient Instructions  Your lung nodule has grown in size and is concerning for a possible lung cancer. I have sent a referral to cardiothoracic surgery to discuss next steps. Someone should contact you in the next week to schedule this.   You need to quit smoking Nicotine patches - apply one 21 mcg patch daily for 6 weeks then decrease to one 14 mcg patch daily for 2 weeks then one 7 mcg patch for 2 weeks.  Repeat pulmonary function testing ordered today - 30 min slot; next available   Follow up after PFT to discuss results with Dr. Sherene Sires or Philis Nettle. If symptoms worsen, please contact office for sooner follow up or seek emergency care.

## 2022-02-11 NOTE — Progress Notes (Signed)
@Terry Fitzgerald  ID: , male    DOB: 08-Oct-1935, 86 y.o.   MRN: [id number]  Chief Complaint  Terry Fitzgerald presents with   Follow-up    Referring provider: [id number], MD  HPI: 86 year old male, active smoker followed for solitary pulmonary nodule.  He is a Terry Fitzgerald Dr. 89 and last seen in office 01/01/2020.  He has been undergoing surveillance CTs.  Past medical history significant for PAD, carotid artery disease, CAD status post CABG, hypertension, history of MI, BPH, gout.  TEST/EVENTS:  05/16/2019 PFT: FVC 92, FEV1 100, ratio 75, TLC 107, DLCOunc 60.  Positive BD 02/03/2022 super D CT chest: Atherosclerosis.  Status post CABG.  No LAD.  Elongated, irregular nodule of the right upper lobe now measuring 2 x 0.7 x 0.6 cm, previously 1.4 x 0.7 x 0.6 cm June 2023.  Mild centrilobular and paraseptal emphysema.  Diffuse bilateral bronchial wall thickening.  Mild pulmonary fibrosis; not UIP.  Multiple bilateral exophytic renal lesions of varying attenuation, incompletely characterized  02/11/2022: Today-follow-up Terry Fitzgerald presents today with his daughter, who is also a Terry Fitzgerald of ours, for review of recent CT chest.  He has had an enlarging right upper lobe pulmonary nodule, now measuring 2 x 0.7 x 0.6 cm, and concerning for malignancy.  He tells me today that his breathing has been stable since he was here last.  He lives a fairly active lifestyle.  Still does household chores and yard work.  Does not feel like his breathing limits him.  He does have a daily chronic cough, which is minimally productive.  He denies any hemoptysis, weight loss, anorexia.  He continues to smoke around half a pack to 3 quarts of a pack a day.  He wants to work on quitting.  Allergies  Allergen Reactions   Drug Class [Morphine And Related] Other (See Comments)    Hallucinations.     There is no immunization history on file for this Terry Fitzgerald.  Past Medical History:  Diagnosis Date   BPH (benign prostatic  hyperplasia)    Gout, unspecified    history x1   Hypertension    Myocardial infarction (HCC) 2011    Tobacco History: Social History   Tobacco Use  Smoking Status Every Day   Packs/day: 0.50   Years: 65.00   Total pack years: 32.50   Types: Cigarettes  Smokeless Tobacco Never  Tobacco Comments   Still smoking 02/11/2022 Tay   Ready to quit: Not Answered Counseling given: Not Answered Tobacco comments: Still smoking 02/11/2022 Tay   Outpatient Medications Prior to Visit  Medication Sig Dispense Refill   aspirin 325 MG tablet Take 325 mg by mouth every evening.     clopidogrel (PLAVIX) 75 MG tablet Take 75 mg by mouth daily.     finasteride (PROSCAR) 5 MG tablet Take 5 mg by mouth every evening.     losartan (COZAAR) 100 MG tablet Take 100 mg by mouth daily.     lovastatin (MEVACOR) 40 MG tablet Take 1 tablet (40 mg total) by mouth at bedtime. 90 tablet 2   pantoprazole (PROTONIX) 40 MG tablet Take 40 mg by mouth daily.     tamsulosin (FLOMAX) 0.4 MG CAPS capsule Take 0.4 mg by mouth daily.     No facility-administered medications prior to visit.     Review of Systems:   Constitutional: No weight loss or gain, night sweats, fevers, chills, fatigue, or lassitude. HEENT: No headaches, difficulty swallowing, tooth/dental problems, or sore throat. No sneezing,  itching, ear ache, nasal congestion, or post nasal drip CV:  No chest pain, orthopnea, PND, swelling in lower extremities, anasarca, dizziness, palpitations, syncope Resp: +chronic cough. No shortness of breath with exertion or at rest. No excess mucus or change in color of mucus. No hemoptysis. No wheezing.  No chest wall deformity GI:  No heartburn, indigestion, abdominal pain, nausea, vomiting, diarrhea, change in bowel habits, loss of appetite, bloody stools.  GU: No dysuria, change in color of urine, urgency or frequency.   Skin: No rash, lesions, ulcerations MSK:  No joint pain or swelling.  Neuro: No dizziness  or lightheadedness.  Psych: No depression or anxiety. Mood stable.     Physical Exam:  BP 112/60 (BP Location: Right Arm)   Pulse 69   Ht 5\' 8"  (1.727 m)   Wt 173 lb 6.4 oz (78.7 kg)   SpO2 96%   BMI 26.37 kg/m   GEN: Pleasant, interactive, well-kempt; in no acute distress. HEENT:  Normocephalic and atraumatic. PERRLA. Sclera white. Nasal turbinates pink, moist and patent bilaterally. No rhinorrhea present. Oropharynx pink and moist, without exudate or edema. No lesions, ulcerations, or postnasal drip.  NECK:  Supple w/ fair ROM. No JVD present. Normal carotid impulses w/o bruits. Thyroid symmetrical with no goiter or nodules palpated. No lymphadenopathy.   CV: RRR, no m/r/g, no peripheral edema. Pulses intact, +2 bilaterally. No cyanosis, pallor or clubbing. PULMONARY:  Unlabored, regular breathing. Clear bilaterally A&P w/o wheezes/rales/rhonchi. No accessory muscle use.  GI: BS present and normoactive. Soft, non-tender to palpation. No organomegaly or masses detected. MSK: No erythema, warmth or tenderness. Cap refil <2 sec all extrem. No deformities or joint swelling noted.  Neuro: A/Ox3. No focal deficits noted.   Skin: Warm, no lesions or rashe Psych: Normal affect and behavior. Judgement and thought content appropriate.     Lab Results:  CBC    Component Value Date/Time   WBC 16.2 (H) 10/26/2019 0802   RBC 3.63 (L) 10/26/2019 0802   HGB 10.3 (L) 10/26/2019 0802   HCT 31.9 (L) 10/26/2019 0802   PLT 228 10/26/2019 0802   MCV 87.9 10/26/2019 0802   MCH 28.4 10/26/2019 0802   MCHC 32.3 10/26/2019 0802   RDW 12.9 10/26/2019 0802    BMET    Component Value Date/Time   NA 133 (L) 10/26/2019 0802   K 4.7 10/26/2019 0802   CL 104 10/26/2019 0802   CO2 20 (L) 10/26/2019 0802   GLUCOSE 132 (H) 10/26/2019 0802   BUN 20 10/26/2019 0802   CREATININE 1.18 10/26/2019 0802   CALCIUM 8.9 10/26/2019 0802   GFRNONAA 58 (L) 10/26/2019 0802   GFRAA >60 10/26/2019 0802     BNP No results found for: "BNP"   Imaging:  CT Super D Chest Wo Contrast  Result Date: 02/06/2022 CLINICAL DATA:  Follow-up solitary pulmonary nodule * Tracking Code: BO * EXAM: CT CHEST WITHOUT CONTRAST TECHNIQUE: Multidetector CT imaging of the chest was performed using thin slice collimation for electromagnetic bronchoscopy planning purposes, without intravenous contrast. RADIATION DOSE REDUCTION: This exam was performed according to the departmental dose-optimization program which includes automated exposure control, adjustment of the mA and/or kV according to Terry Fitzgerald size and/or use of iterative reconstruction technique. COMPARISON:  07/27/2021, 07/15/2020, 02/17/2020 FINDINGS: Cardiovascular: Aortic atherosclerosis. Aortic valve calcifications. Normal heart size. Three-vessel coronary artery calcifications status post median sternotomy and CABG. No pericardial effusion. Mediastinum/Nodes: No enlarged mediastinal, hilar, or axillary lymph nodes. Thyroid gland, trachea, and esophagus demonstrate no significant  findings. Lungs/Pleura: Elongated, irregular nodule of the right upper lobe, which is predominantly oriented craniocaudally, measuring 2.0 x 0.7 x 0.6 cm (series 6, image 54, series 4, image 45). This previously measured 1.4 x 0.7 x 0.6 cm on examination dated 07/27/2021 and 0.7 x 0.4 x 0.3 cm on examination dated 07/15/2020. Mild centrilobular and paraseptal emphysema. Diffuse bilateral bronchial wall thickening. Mild pulmonary fibrosis in a pattern with apical to basal gradient, featuring irregular peripheral interstitial opacity, septal thickening, and a preponderance of ground-glass of the bilateral lung bases, however without clear evidence of subpleural bronchiolectasis or honeycombing. No pleural effusion or pneumothorax. Upper Abdomen: No acute abnormality. Multiple bilateral exophytic renal lesions of varying attenuation, incompletely characterized by noncontrast CT of the chest.  Musculoskeletal: No chest wall abnormality. No acute osseous findings. IMPRESSION: 1. Elongated, irregular nodule of the right upper lobe, which is predominantly oriented craniocaudally, measuring 2.0 x 0.7 x 0.6 cm. This previously measured 1.4 x 0.7 x 0.6 cm on examination dated 07/27/2021 and 0.7 x 0.4 x 0.3 cm on examination dated 07/15/2020. Continued enlargement over time is highly concerning for primary lung malignancy. 2. Mild emphysema and diffuse bilateral bronchial wall thickening. 3. Mild pulmonary fibrosis in a pattern with apical to basal gradient, featuring irregular peripheral interstitial opacity, septal thickening, and a preponderance of ground-glass of the bilateral lung bases, however without clear evidence of subpleural bronchiolectasis or honeycombing. Findings are suggestive of an alternative diagnosis (not UIP) per consensus guidelines: Diagnosis of Idiopathic Pulmonary Fibrosis: An Official ATS/ERS/JRS/ALAT Clinical Practice Guideline. [place], Iss 5, [phone], Oct 15 2016. 4. Multiple bilateral exophytic renal lesions of varying attenuation, incompletely characterized by noncontrast CT of the chest. Consider dedicated imaging of the abdomen and pelvis with and without contrast 4 further evaluation. 5. Coronary artery disease. These results will be called to the ordering clinician or representative by the Radiologist Assistant, and communication documented in the PACS or [employer]. Aortic Atherosclerosis (ICD10-I70.0) and Emphysema (ICD10-J43.9). Electronically Signed   By: Delanna Ahmadi M.D.   On: 02/06/2022 22:39         Latest Ref Rng & Units 05/16/2019    9:54 AM  PFT Results  FVC-Pre L 3.22   FVC-Predicted Pre % 92   FVC-Post L 3.62   FVC-Predicted Post % 103   Pre FEV1/FVC % % 76   Post FEV1/FCV % % 75   FEV1-Pre L 2.45   FEV1-Predicted Pre % 100   FEV1-Post L 2.71   DLCO uncorrected ml/min/mmHg 13.40   DLCO UNC% % 60   DLVA Predicted %  64   TLC L 6.93   TLC % Predicted % 107   RV % Predicted % 145     No results found for: "NITRICOXIDE"      Assessment & Plan:   Solitary pulmonary nodule on lung CT Increasing RUL nodule, suspicious for malignancy. Reviewed potential next steps including repeat PET scan vs referral to thoracic surgery for possible biopsy/surgical resection. He opted to see thoracic surgery first - referral placed today. He is fairly active and previous lung function was normal so suspect he would be a candidate for surgical intervention, should he quit smoking. I have counseled him on the importance of this. We will obtain repeat spirometry/DLCO prior to his appt with CT surgery given his last set of PFTs were from 2021.   Terry Fitzgerald Instructions  Your lung nodule has grown in size and is concerning for a possible  lung cancer. I have sent a referral to cardiothoracic surgery to discuss next steps. Someone should contact you in the next week to schedule this.   You need to quit smoking Nicotine patches - apply one 21 mcg patch daily for 6 weeks then decrease to one 14 mcg patch daily for 2 weeks then one 7 mcg patch for 2 weeks.  Repeat pulmonary function testing ordered today - 30 min slot; next available   Follow up after PFT to discuss results with Dr. Melvyn Novas or Alanson Aly. If symptoms worsen, please contact office for sooner follow up or seek emergency care.    Cigarette smoker The Terry Fitzgerald's current tobacco use: 1/2-2/4 ppd The Terry Fitzgerald was advised to quit and impact of smoking on their health.  I assessed the Terry Fitzgerald's willingness to attempt to quit. I provided methods and skills for cessation. We reviewed medication management of smoking session drugs if appropriate. Nicotine patches provided  Resources to help quit smoking were provided. A smoking cessation quit date was set: 02/14/2022 Follow-up was arranged in our clinic.  The amount of time spent counseling Terry Fitzgerald was 5 mins    I spent  35 minutes of dedicated to the care of this Terry Fitzgerald on the date of this encounter to include pre-visit review of records, face-to-face time with the Terry Fitzgerald discussing conditions above, post visit ordering of testing, clinical documentation with the electronic health record, making appropriate referrals as documented, and communicating necessary findings to members of the patients care team.  Clayton Bibles, NP 02/11/2022  Pt aware and understands NP's role.

## 2022-02-17 DIAGNOSIS — J40 Bronchitis, not specified as acute or chronic: Secondary | ICD-10-CM | POA: Diagnosis not present

## 2022-02-17 DIAGNOSIS — Z79899 Other long term (current) drug therapy: Secondary | ICD-10-CM | POA: Diagnosis not present

## 2022-02-17 DIAGNOSIS — Z0000 Encounter for general adult medical examination without abnormal findings: Secondary | ICD-10-CM | POA: Diagnosis not present

## 2022-02-17 DIAGNOSIS — E7849 Other hyperlipidemia: Secondary | ICD-10-CM | POA: Diagnosis not present

## 2022-02-17 DIAGNOSIS — I10 Essential (primary) hypertension: Secondary | ICD-10-CM | POA: Diagnosis not present

## 2022-02-17 DIAGNOSIS — E782 Mixed hyperlipidemia: Secondary | ICD-10-CM | POA: Diagnosis not present

## 2022-02-17 DIAGNOSIS — I7389 Other specified peripheral vascular diseases: Secondary | ICD-10-CM | POA: Diagnosis not present

## 2022-02-17 DIAGNOSIS — M545 Low back pain, unspecified: Secondary | ICD-10-CM | POA: Diagnosis not present

## 2022-03-01 ENCOUNTER — Encounter: Payer: Self-pay | Admitting: Nurse Practitioner

## 2022-03-01 ENCOUNTER — Ambulatory Visit (INDEPENDENT_AMBULATORY_CARE_PROVIDER_SITE_OTHER): Payer: 59 | Admitting: Nurse Practitioner

## 2022-03-01 ENCOUNTER — Ambulatory Visit (INDEPENDENT_AMBULATORY_CARE_PROVIDER_SITE_OTHER): Payer: 59 | Admitting: Internal Medicine

## 2022-03-01 VITALS — BP 102/60 | HR 71 | Ht 68.0 in | Wt 171.8 lb

## 2022-03-01 DIAGNOSIS — R911 Solitary pulmonary nodule: Secondary | ICD-10-CM | POA: Diagnosis not present

## 2022-03-01 DIAGNOSIS — F17210 Nicotine dependence, cigarettes, uncomplicated: Secondary | ICD-10-CM | POA: Diagnosis not present

## 2022-03-01 LAB — PULMONARY FUNCTION TEST
DL/VA % pred: 65 %
DL/VA: 2.52 ml/min/mmHg/L
DLCO cor % pred: 62 %
DLCO cor: 13.62 ml/min/mmHg
DLCO unc % pred: 62 %
DLCO unc: 13.62 ml/min/mmHg
FEF 25-75 Pre: 2.19 L/sec
FEF2575-%Pred-Pre: 145 %
FEV1-%Pred-Pre: 108 %
FEV1-Pre: 2.54 L
FEV1FVC-%Pred-Pre: 108 %
FEV6-%Pred-Pre: 105 %
FEV6-Pre: 3.28 L
FEV6FVC-%Pred-Pre: 107 %
FVC-%Pred-Pre: 98 %
FVC-Pre: 3.31 L
Pre FEV1/FVC ratio: 77 %
Pre FEV6/FVC Ratio: 99 %

## 2022-03-01 NOTE — Assessment & Plan Note (Signed)
We again discussed importance of smoking cessation. He has cut back from 3/4 pack to 1/2 pack. Advised him to start using nicotine patches previously prescribed. He was agreeable to this.

## 2022-03-01 NOTE — Progress Notes (Signed)
@Patient  ID: Terry Fitzgerald, male    DOB: 1935-07-28, 87 y.o.   MRN: [id number]  Chief Complaint  Patient presents with   Follow-up    Pt f/u after PFT to discuss results. He reports his breathing is okay and no questions/concerns otherwise    Referring provider: Neale Burly, MD  HPI: 87 year old male, active smoker followed for solitary pulmonary nodule.  He is a patient Dr. Gustavus Bryant and last seen in office 02/11/2022 by [facility] NP.  He has been undergoing surveillance CTs.  Past medical history significant for PAD, carotid artery disease, CAD status post CABG, hypertension, history of MI, BPH, gout.  TEST/EVENTS:  05/16/2019 PFT: FVC 92, FEV1 100, ratio 75, TLC 107, DLCOunc 60.  Positive BD 02/03/2022 super D CT chest: Atherosclerosis.  Status post CABG.  No LAD.  Elongated, irregular nodule of the right upper lobe now measuring 2 x 0.7 x 0.6 cm, previously 1.4 x 0.7 x 0.6 cm June 2023.  Mild centrilobular and paraseptal emphysema.  Diffuse bilateral bronchial wall thickening.  Mild pulmonary fibrosis; not UIP.  Multiple bilateral exophytic renal lesions of varying attenuation, incompletely characterized 03/01/2022 PFT: FVC 98, FEV1 108, ratio 77, DLCO 62  02/11/2022: OV with Koralynn Greenspan NP today with his daughter, who is also a patient of ours, for review of recent CT chest.  He has had an enlarging right upper lobe pulmonary nodule, now measuring 2 x 0.7 x 0.6 cm, and concerning for malignancy.  He tells me today that his breathing has been stable since he was here last.  He lives a fairly active lifestyle.  Still does household chores and yard work.  Does not feel like his breathing limits him.  He does have a daily chronic cough, which is minimally productive.  He denies any hemoptysis, weight loss, anorexia.  He continues to smoke around half a pack to 3 quarts of a pack a day.  He wants to work on quitting. Referral to CT surgery. PFTs ordered. Nicotine patches prescribed.  03/01/2022: Today -  follow up Patient presents today for follow up after pulmonary function testing. He has normal lung function and stable compared to 2 years ago. He has a moderate diffusion defect at 62%, corrected to 65% for alveolar volume, which is stable compared to his previous testing as well. Feeling unchanged compared to last visit. Awaiting appointment with Dr. Roxan Hockey on 1/23. Denies hemoptysis, weight loss, anorexia, increased dyspnea. He still continues to smoke. Down to less than 1/2 pack a day. Has not started nicotine patches.   Allergies  Allergen Reactions   Drug Class [Morphine And Related] Other (See Comments)    Hallucinations.     There is no immunization history on file for this patient.  Past Medical History:  Diagnosis Date   BPH (benign prostatic hyperplasia)    Gout, unspecified    history x1   Hypertension    Myocardial infarction ([place]) 2011    Tobacco History: Social History   Tobacco Use  Smoking Status Every Day   Packs/day: 0.40   Years: 65.00   Total pack years: 26.00   Types: Cigarettes  Smokeless Tobacco Never  Tobacco Comments   Less than a 0.5ppd       HEI, 03/01/2022   Ready to quit: Not Answered Counseling given: Not Answered Tobacco comments: Less than a 0.5ppd   HEI, 03/01/2022   Outpatient Medications Prior to Visit  Medication Sig Dispense Refill   aspirin 325 MG tablet Take 325  mg by mouth every evening.     clopidogrel (PLAVIX) 75 MG tablet Take 75 mg by mouth daily.     finasteride (PROSCAR) 5 MG tablet Take 5 mg by mouth every evening.     losartan (COZAAR) 100 MG tablet Take 100 mg by mouth daily.     lovastatin (MEVACOR) 40 MG tablet Take 1 tablet (40 mg total) by mouth at bedtime. 90 tablet 2   nicotine (NICODERM CQ - DOSED IN MG/24 HOURS) 21 mg/24hr patch Place 1 patch (21 mg total) onto the skin daily. 42 patch 0   pantoprazole (PROTONIX) 40 MG tablet Take 40 mg by mouth daily.     tamsulosin (FLOMAX) 0.4 MG CAPS capsule Take 0.4  mg by mouth daily.     No facility-administered medications prior to visit.     Review of Systems:   Constitutional: No weight loss or gain, night sweats, fevers, chills, fatigue, or lassitude. HEENT: No headaches, difficulty swallowing, tooth/dental problems, or sore throat. No sneezing, itching, ear ache, nasal congestion, or post nasal drip CV:  No chest pain, orthopnea, PND, swelling in lower extremities, anasarca, dizziness, palpitations, syncope Resp: +chronic cough. No shortness of breath with exertion or at rest. No excess mucus or change in color of mucus. No hemoptysis. No wheezing.  No chest wall deformity GI:  No heartburn, indigestion, abdominal pain, nausea, vomiting, diarrhea, change in bowel habits, loss of appetite, bloody stools.  GU: No dysuria, change in color of urine, urgency or frequency.   Skin: No rash, lesions, ulcerations MSK:  No joint pain or swelling.  Neuro: No dizziness or lightheadedness.  Psych: No depression or anxiety. Mood stable.     Physical Exam:  BP 102/60   Pulse 71   Ht 5\' 8"  (1.727 m)   Wt 171 lb 12.8 oz (77.9 kg)   SpO2 96%   BMI 26.12 kg/m   GEN: Pleasant, interactive, well-kempt; in no acute distress. HEENT:  Normocephalic and atraumatic. PERRLA. Sclera white. Nasal turbinates pink, moist and patent bilaterally. No rhinorrhea present. Oropharynx pink and moist, without exudate or edema. No lesions, ulcerations, or postnasal drip.  NECK:  Supple w/ fair ROM. No JVD present. Normal carotid impulses w/o bruits. Thyroid symmetrical with no goiter or nodules palpated. No lymphadenopathy.   CV: RRR, no m/r/g, no peripheral edema. Pulses intact, +2 bilaterally. No cyanosis, pallor or clubbing. PULMONARY:  Unlabored, regular breathing. Clear bilaterally A&P w/o wheezes/rales/rhonchi. No accessory muscle use.  GI: BS present and normoactive. Soft, non-tender to palpation. No organomegaly or masses detected. MSK: No erythema, warmth or  tenderness. Cap refil <2 sec all extrem. No deformities or joint swelling noted.  Neuro: A/Ox3. No focal deficits noted.   Skin: Warm, no lesions or rashe Psych: Normal affect and behavior. Judgement and thought content appropriate.     Lab Results:  CBC    Component Value Date/Time   WBC 16.2 (H) 10/26/2019 0802   RBC 3.63 (L) 10/26/2019 0802   HGB 10.3 (L) 10/26/2019 0802   HCT 31.9 (L) 10/26/2019 0802   PLT 228 10/26/2019 0802   MCV 87.9 10/26/2019 0802   MCH 28.4 10/26/2019 0802   MCHC 32.3 10/26/2019 0802   RDW 12.9 10/26/2019 0802    BMET    Component Value Date/Time   NA 133 (L) 10/26/2019 0802   K 4.7 10/26/2019 0802   CL 104 10/26/2019 0802   CO2 20 (L) 10/26/2019 0802   GLUCOSE 132 (H) 10/26/2019 0802   BUN  20 10/26/2019 0802   CREATININE 1.18 10/26/2019 0802   CALCIUM 8.9 10/26/2019 0802   GFRNONAA 58 (L) 10/26/2019 0802   GFRAA >60 10/26/2019 0802    BNP No results found for: "BNP"   Imaging:  CT Super D Chest Wo Contrast  Result Date: 02/06/2022 CLINICAL DATA:  Follow-up solitary pulmonary nodule * Tracking Code: BO * EXAM: CT CHEST WITHOUT CONTRAST TECHNIQUE: Multidetector CT imaging of the chest was performed using thin slice collimation for electromagnetic bronchoscopy planning purposes, without intravenous contrast. RADIATION DOSE REDUCTION: This exam was performed according to the departmental dose-optimization program which includes automated exposure control, adjustment of the mA and/or kV according to patient size and/or use of iterative reconstruction technique. COMPARISON:  07/27/2021, 07/15/2020, 02/17/2020 FINDINGS: Cardiovascular: Aortic atherosclerosis. Aortic valve calcifications. Normal heart size. Three-vessel coronary artery calcifications status post median sternotomy and CABG. No pericardial effusion. Mediastinum/Nodes: No enlarged mediastinal, hilar, or axillary lymph nodes. Thyroid gland, trachea, and esophagus demonstrate no  significant findings. Lungs/Pleura: Elongated, irregular nodule of the right upper lobe, which is predominantly oriented craniocaudally, measuring 2.0 x 0.7 x 0.6 cm (series 6, image 54, series 4, image 45). This previously measured 1.4 x 0.7 x 0.6 cm on examination dated 07/27/2021 and 0.7 x 0.4 x 0.3 cm on examination dated 07/15/2020. Mild centrilobular and paraseptal emphysema. Diffuse bilateral bronchial wall thickening. Mild pulmonary fibrosis in a pattern with apical to basal gradient, featuring irregular peripheral interstitial opacity, septal thickening, and a preponderance of ground-glass of the bilateral lung bases, however without clear evidence of subpleural bronchiolectasis or honeycombing. No pleural effusion or pneumothorax. Upper Abdomen: No acute abnormality. Multiple bilateral exophytic renal lesions of varying attenuation, incompletely characterized by noncontrast CT of the chest. Musculoskeletal: No chest wall abnormality. No acute osseous findings. IMPRESSION: 1. Elongated, irregular nodule of the right upper lobe, which is predominantly oriented craniocaudally, measuring 2.0 x 0.7 x 0.6 cm. This previously measured 1.4 x 0.7 x 0.6 cm on examination dated 07/27/2021 and 0.7 x 0.4 x 0.3 cm on examination dated 07/15/2020. Continued enlargement over time is highly concerning for primary lung malignancy. 2. Mild emphysema and diffuse bilateral bronchial wall thickening. 3. Mild pulmonary fibrosis in a pattern with apical to basal gradient, featuring irregular peripheral interstitial opacity, septal thickening, and a preponderance of ground-glass of the bilateral lung bases, however without clear evidence of subpleural bronchiolectasis or honeycombing. Findings are suggestive of an alternative diagnosis (not UIP) per consensus guidelines: Diagnosis of Idiopathic Pulmonary Fibrosis: An Official ATS/ERS/JRS/ALAT Clinical Practice Guideline. [place], Iss 5, [id number], Oct 15 2016. 4. Multiple bilateral exophytic renal lesions of varying attenuation, incompletely characterized by noncontrast CT of the chest. Consider dedicated imaging of the abdomen and pelvis with and without contrast 4 further evaluation. 5. Coronary artery disease. These results will be called to the ordering clinician or representative by the Radiologist Assistant, and communication documented in the PACS or [employer]. Aortic Atherosclerosis (ICD10-I70.0) and Emphysema (ICD10-J43.9). Electronically Signed   By: Delanna Ahmadi M.D.   On: 02/06/2022 22:39         Latest Ref Rng & Units 03/01/2022    9:19 AM 05/16/2019    9:54 AM  PFT Results  FVC-Pre L 3.31  P 3.22   FVC-Predicted Pre % 98  P 92   FVC-Post L  3.62   FVC-Predicted Post %  103   Pre FEV1/FVC % % 77  P 76   Post FEV1/FCV % %  75   FEV1-Pre L 2.54  P 2.45   FEV1-Predicted Pre % 108  P 100   FEV1-Post L  2.71   DLCO uncorrected ml/min/mmHg 13.62  P 13.40   DLCO UNC% % 62  P 60   DLCO corrected ml/min/mmHg 13.62  P   DLCO COR %Predicted % 62  P   DLVA Predicted % 65  P 64   TLC L  6.93   TLC % Predicted %  107   RV % Predicted %  145     P Preliminary result    No results found for: "NITRICOXIDE"      Assessment & Plan:   Solitary pulmonary nodule on lung CT Increasing RUL nodule, suspicious for malignancy. Reviewed potential next steps at his previous visit including repeat PET scan vs referral to thoracic surgery for possible biopsy/surgical resection. He opted to see thoracic surgery first; upcoming appointment with Dr. Roxan Hockey 1/23. He is fairly active and lung function is normal with moderate diffusion defect so suspect he would be a candidate for surgical intervention, should he quit smoking. We have reviewed the importance of this again today.  Patient Instructions  Your lung nodule has grown in size and is concerning for a possible lung cancer. Attend your appointment with thoracic surgery on 1/23 with  Dr. Roxan Hockey.    You need to quit smoking Nicotine patches - apply one 21 mcg patch daily for 6 weeks then decrease to one 14 mcg patch daily for 2 weeks then one 7 mcg patch for 2 weeks.   Follow up in 3 months with Dr. Melvyn Novas. If symptoms worsen, please contact office for sooner follow up or seek emergency care.    Cigarette smoker We again discussed importance of smoking cessation. He has cut back from 3/4 pack to 1/2 pack. Advised him to start using nicotine patches previously prescribed. He was agreeable to this.    I spent 25 minutes of dedicated to the care of this patient on the date of this encounter to include pre-visit review of records, face-to-face time with the patient discussing conditions above, post visit ordering of testing, clinical documentation with the electronic health record, making appropriate referrals as documented, and communicating necessary findings to members of the patients care team.  Clayton Bibles, NP 03/01/2022  Pt aware and understands NP's role.

## 2022-03-01 NOTE — Patient Instructions (Signed)
Your lung nodule has grown in size and is concerning for a possible lung cancer. Attend your appointment with thoracic surgery on 1/23 with Dr. Roxan Hockey.    You need to quit smoking Nicotine patches - apply one 21 mcg patch daily for 6 weeks then decrease to one 14 mcg patch daily for 2 weeks then one 7 mcg patch for 2 weeks.   Follow up in 3 months with Dr. Melvyn Novas. If symptoms worsen, please contact office for sooner follow up or seek emergency care.

## 2022-03-01 NOTE — Progress Notes (Signed)
Spirometry and Dlco done today. 

## 2022-03-01 NOTE — Assessment & Plan Note (Signed)
Increasing RUL nodule, suspicious for malignancy. Reviewed potential next steps at his previous visit including repeat PET scan vs referral to thoracic surgery for possible biopsy/surgical resection. He opted to see thoracic surgery first; upcoming appointment with Dr. Roxan Hockey 1/23. He is fairly active and lung function is normal with moderate diffusion defect so suspect he would be a candidate for surgical intervention, should he quit smoking. We have reviewed the importance of this again today.  Patient Instructions  Your lung nodule has grown in size and is concerning for a possible lung cancer. Attend your appointment with thoracic surgery on 1/23 with Dr. Roxan Hockey.    You need to quit smoking Nicotine patches - apply one 21 mcg patch daily for 6 weeks then decrease to one 14 mcg patch daily for 2 weeks then one 7 mcg patch for 2 weeks.   Follow up in 3 months with Dr. Melvyn Novas. If symptoms worsen, please contact office for sooner follow up or seek emergency care.

## 2022-03-08 ENCOUNTER — Encounter: Payer: Self-pay | Admitting: Thoracic Surgery (Cardiothoracic Vascular Surgery)

## 2022-03-08 ENCOUNTER — Institutional Professional Consult (permissible substitution) (INDEPENDENT_AMBULATORY_CARE_PROVIDER_SITE_OTHER): Payer: 59 | Admitting: Thoracic Surgery (Cardiothoracic Vascular Surgery)

## 2022-03-08 ENCOUNTER — Other Ambulatory Visit: Payer: Self-pay | Admitting: Thoracic Surgery (Cardiothoracic Vascular Surgery)

## 2022-03-08 VITALS — BP 150/67 | HR 63 | Resp 20 | Ht 68.0 in | Wt 180.0 lb

## 2022-03-08 DIAGNOSIS — R911 Solitary pulmonary nodule: Secondary | ICD-10-CM

## 2022-03-08 NOTE — Progress Notes (Signed)
PCP is Toma Deiters, MD Referring Provider is Noemi Chapel, NP  Chief Complaint  Patient presents with   Lung Lesion    CT chest 6/13, CT super D 12/21, PFT 1/16    HPI: Terry Fitzgerald is sent for consultation regarding a right upper lobe lung nodule.  Terry Fitzgerald is an 87 year old man with a past medical history significant for tobacco abuse, pulmonary fibrosis, MI, CAD, CABG, hypertension, PAD, bilateral carotid endarterectomies, carotid stent, and BPH.  In 2021 he had a CT of the neck.  He was noted to have a left upper lobe lung nodule.  A follow-up PET/CT in 6 months showed the nodule was slightly smaller and had minimal activity.  That nodule eventually resolved.  While he was being followed for that nodule, he was noted to have a right upper lobe nodule.  A recent CT showed an increase in size in the craniocaudal dimension.  He smoked about a pack a day for 65 years.  He had been cutting down for quite a while and says he quit about 2 weeks ago.  He sometimes gets short of breath when he is out in his yard working but not at baseline.  No chest pain, pressure, or tightness.  Some confusion about when his bypass surgery was.  According to the chart it was 2011 but he thinks it may have been around 2004.  That surgery was done in [place].  No change in appetite or weight loss.   Past Medical History:  Diagnosis Date   BPH (benign prostatic hyperplasia)    Gout, unspecified    history x1   Hypertension    Myocardial infarction [facility]) 2011    Past Surgical History:  Procedure Laterality Date   APPENDECTOMY     CATARACT EXTRACTION W/PHACO Right 05/27/2019   Procedure: CATARACT EXTRACTION PHACO AND INTRAOCULAR LENS PLACEMENT (IOC);  Surgeon: Fabio Pierce, MD;  Location: [facility];  Service: Ophthalmology;  Laterality: Right;  CDE: 15.37   CATARACT EXTRACTION W/PHACO Left 06/14/2019   Procedure: CATARACT EXTRACTION PHACO AND INTRAOCULAR LENS PLACEMENT LEFT EYE;  Surgeon:  Fabio Pierce, MD;  Location: [facility];  Service: Ophthalmology;  Laterality: Left;  CDE: 20.79   CORONARY ARTERY BYPASS GRAFT     triple bypass 2011   HERNIA REPAIR     Rih   TONSILLECTOMY     TRANSCAROTID ARTERY REVASCULARIZATION  Right 10/25/2019   Procedure: RIGHT TRANSCAROTID ARTERY REVASCULARIZATION;  Surgeon: Cephus Shelling, MD;  Location: [facility] OR;  Service: Vascular;  Laterality: Right;   ULTRASOUND GUIDANCE FOR VASCULAR ACCESS Left 10/25/2019   Procedure: ULTRASOUND GUIDANCE FOR VASCULAR ACCESS;  Surgeon: Cephus Shelling, MD;  Location: [facility] OR;  Service: Vascular;  Laterality: Left;    History reviewed. No pertinent family history.  Social History Social History   Tobacco Use   Smoking status: Every Day    Packs/day: 0.40    Years: 65.00    Total pack years: 26.00    Types: Cigarettes   Smokeless tobacco: Never   Tobacco comments:    Less than a 0.5ppd         HEI, 03/01/2022  Vaping Use   Vaping Use: Never used  Substance Use Topics   Alcohol use: Not Currently   Drug use: Never    Current Outpatient Medications  Medication Sig Dispense Refill   aspirin 325 MG tablet Take 325 mg by mouth every evening.     clopidogrel (PLAVIX) 75 MG tablet Take  75 mg by mouth daily.     finasteride (PROSCAR) 5 MG tablet Take 5 mg by mouth every evening.     losartan (COZAAR) 100 MG tablet Take 100 mg by mouth daily.     lovastatin (MEVACOR) 40 MG tablet Take 1 tablet (40 mg total) by mouth at bedtime. 90 tablet 2   nicotine (NICODERM CQ - DOSED IN MG/24 HOURS) 21 mg/24hr patch Place 1 patch (21 mg total) onto the skin daily. 42 patch 0   pantoprazole (PROTONIX) 40 MG tablet Take 40 mg by mouth daily.     tamsulosin (FLOMAX) 0.4 MG CAPS capsule Take 0.4 mg by mouth daily.     No current facility-administered medications for this visit.    Allergies  Allergen Reactions   Drug Class [Morphine And Related] Other (See Comments)    Hallucinations.    Review of Systems   Constitutional:  Negative for activity change, chills, fatigue, fever and unexpected weight change.  HENT:  Negative for trouble swallowing and voice change.   Eyes:  Negative for visual disturbance.  Respiratory:  Positive for cough and shortness of breath. Negative for wheezing.   Cardiovascular:  Negative for chest pain and leg swelling.  Genitourinary:  Positive for difficulty urinating.  Neurological:  Negative for syncope and weakness.  Hematological:  Negative for adenopathy. Bruises/bleeds easily (On Plavix).  All other systems reviewed and are negative.   BP (!) 150/67 (BP Location: Left Arm, Patient Position: Sitting)   Pulse 63   Resp 20   Ht 5\' 8"  (1.727 m)   Wt 180 lb (81.6 kg)   SpO2 98% Comment: rA  BMI 27.37 kg/m  Physical Exam Vitals reviewed.  Constitutional:      General: He is not in acute distress.    Appearance: Normal appearance.  HENT:     Head: Normocephalic and atraumatic.  Eyes:     General: No scleral icterus.    Extraocular Movements: Extraocular movements intact.  Cardiovascular:     Rate and Rhythm: Normal rate and regular rhythm.     Heart sounds: Normal heart sounds. No murmur heard.    No friction rub. No gallop.  Pulmonary:     Effort: Pulmonary effort is normal. No respiratory distress.     Breath sounds: Normal breath sounds. No wheezing.  Abdominal:     General: There is no distension.     Palpations: Abdomen is soft.  Musculoskeletal:     Cervical back: Neck supple.  Lymphadenopathy:     Cervical: No cervical adenopathy.  Skin:    General: Skin is warm and dry.  Neurological:     General: No focal deficit present.     Mental Status: He is alert and oriented to person, place, and time.     Cranial Nerves: No cranial nerve deficit.     Motor: No weakness.    Diagnostic Tests: CT CHEST WITHOUT CONTRAST   TECHNIQUE: Multidetector CT imaging of the chest was performed using thin slice collimation for electromagnetic  bronchoscopy planning purposes, without intravenous contrast.   RADIATION DOSE REDUCTION: This exam was performed according to the departmental dose-optimization program which includes automated exposure control, adjustment of the mA and/or kV according to patient size and/or use of iterative reconstruction technique.   COMPARISON:  07/27/2021, 07/15/2020, 02/17/2020   FINDINGS: Cardiovascular: Aortic atherosclerosis. Aortic valve calcifications. Normal heart size. Three-vessel coronary artery calcifications status post median sternotomy and CABG. No pericardial effusion.   Mediastinum/Nodes: No enlarged mediastinal, hilar, or  axillary lymph nodes. Thyroid gland, trachea, and esophagus demonstrate no significant findings.   Lungs/Pleura: Elongated, irregular nodule of the right upper lobe, which is predominantly oriented craniocaudally, measuring 2.0 x 0.7 x 0.6 cm (series 6, image 54, series 4, image 45). This previously measured 1.4 x 0.7 x 0.6 cm on examination dated 07/27/2021 and 0.7 x 0.4 x 0.3 cm on examination dated 07/15/2020. Mild centrilobular and paraseptal emphysema. Diffuse bilateral bronchial wall thickening. Mild pulmonary fibrosis in a pattern with apical to basal gradient, featuring irregular peripheral interstitial opacity, septal thickening, and a preponderance of ground-glass of the bilateral lung bases, however without clear evidence of subpleural bronchiolectasis or honeycombing. No pleural effusion or pneumothorax.   Upper Abdomen: No acute abnormality. Multiple bilateral exophytic renal lesions of varying attenuation, incompletely characterized by noncontrast CT of the chest.   Musculoskeletal: No chest wall abnormality. No acute osseous findings.   IMPRESSION: 1. Elongated, irregular nodule of the right upper lobe, which is predominantly oriented craniocaudally, measuring 2.0 x 0.7 x 0.6 cm. This previously measured 1.4 x 0.7 x 0.6 cm on examination  dated 07/27/2021 and 0.7 x 0.4 x 0.3 cm on examination dated 07/15/2020. Continued enlargement over time is highly concerning for primary lung malignancy. 2. Mild emphysema and diffuse bilateral bronchial wall thickening. 3. Mild pulmonary fibrosis in a pattern with apical to basal gradient, featuring irregular peripheral interstitial opacity, septal thickening, and a preponderance of ground-glass of the bilateral lung bases, however without clear evidence of subpleural bronchiolectasis or honeycombing. Findings are suggestive of an alternative diagnosis (not UIP) per consensus guidelines: Diagnosis of Idiopathic Pulmonary Fibrosis: An Official ATS/ERS/JRS/ALAT Clinical Practice Guideline. [place], Iss 5, [id number], Oct 15 2016. 4. Multiple bilateral exophytic renal lesions of varying attenuation, incompletely characterized by noncontrast CT of the chest. Consider dedicated imaging of the abdomen and pelvis with and without contrast 4 further evaluation. 5. Coronary artery disease.   These results will be called to the ordering clinician or representative by the Radiologist Assistant, and communication documented in the PACS or [employer].   Aortic Atherosclerosis (ICD10-I70.0) and Emphysema (ICD10-J43.9).     Electronically Signed   By: Delanna Ahmadi M.D.   On: 02/06/2022 22:39 I personally reviewed the CT images.  There is a right upper lobe nodule.  To me it appears to be a distinct nodule with some extension along the airway.  The nodular part measures about 7 mm which is unchanged from his previous scan.  The extension has increased in size.  Previously noted left upper lobe nodule has completely resolved.  Pulmonary function testing 03/01/2022 FVC 3.31 (98%) FEV1 2.54 (108%) DLCO 13.62 (62%)  Impression: Terry Fitzgerald is an 87 year old man with a past medical history significant for tobacco abuse, pulmonary fibrosis, MI, CAD, CABG,  hypertension, PAD, bilateral carotid endarterectomies, carotid stent, BPH and lung nodules.  Right upper lobe lung nodule-appears to be about a 7 mm nodule with some likely mucus impaction in the airway behind that accounting for the increase in size recently.  Cannot completely rule out a tumor extending along the airway and cannot completely rule out the possibility that it is a tumor causing airway obstruction.  I do not think the increase in size is due to rapid growth of malignancy.  Appearance is very similar to the left upper lobe nodule that eventually resolved.  Since he had a previous nodule on the opposite side that resolved, I would recommend we do a  PET/CT to see how much, if any, activity is associated with this nodule.  That will guide our initial diagnostic workup.  Given his age and complex cardiovascular history, the neck step would either be continued radiographic observation or navigational biopsy.  Tobacco abuse-about 65-pack-year history overall.  Quit about 2 weeks ago.  Congratulated him for that.  CAD-CABG over 10 years ago.  If we were to eventually go to surgery would definitely need cardiology clearance.  No chest pain at present.  Plan: PET/CT to guide initial diagnostic workup Return in 2 to 3 weeks after PET/CT done.  I spent over 30 minutes in review of records, images, and in consultation with Terry Fitzgerald today. Melrose Nakayama, MD [facility] [phone]

## 2022-03-09 ENCOUNTER — Telehealth: Payer: Self-pay | Admitting: *Deleted

## 2022-03-09 ENCOUNTER — Ambulatory Visit: Payer: Medicare Other | Admitting: Internal Medicine

## 2022-03-09 NOTE — Telephone Encounter (Signed)
Patient's daughter contacted the office requesting information regarding surgical consult patient had with Dr. Roxan Hockey yesterday. Per daughter, patient had her on speaker phone, however she was unable to hear the conversation. Spoke with patient himself with. Patient identified himself and gave permission to speak with daughter, Cork, regarding plan of care. Advised, per note, that further imagining is required for surgical work up. Advised that imaging center will contact patient to set up PET appt and then follow up appt will occur here with Dr. Roxan Hockey after results are provided. Jonelle Sports thankful for information.

## 2022-03-23 ENCOUNTER — Ambulatory Visit (HOSPITAL_COMMUNITY)
Admission: RE | Admit: 2022-03-23 | Discharge: 2022-03-23 | Disposition: A | Discharge status: Home / Self Care | Payer: Medicare HMO | Source: Ambulatory Visit | Attending: Thoracic Surgery (Cardiothoracic Vascular Surgery) | Admitting: Thoracic Surgery (Cardiothoracic Vascular Surgery)

## 2022-03-23 DIAGNOSIS — R911 Solitary pulmonary nodule: Secondary | ICD-10-CM | POA: Diagnosis not present

## 2022-03-23 DIAGNOSIS — R6889 Other general symptoms and signs: Secondary | ICD-10-CM | POA: Diagnosis not present

## 2022-03-23 DIAGNOSIS — N281 Cyst of kidney, acquired: Secondary | ICD-10-CM | POA: Diagnosis not present

## 2022-03-23 DIAGNOSIS — J439 Emphysema, unspecified: Secondary | ICD-10-CM | POA: Diagnosis not present

## 2022-03-23 DIAGNOSIS — I2510 Atherosclerotic heart disease of native coronary artery without angina pectoris: Secondary | ICD-10-CM | POA: Diagnosis not present

## 2022-03-23 DIAGNOSIS — J8410 Pulmonary fibrosis, unspecified: Secondary | ICD-10-CM | POA: Diagnosis not present

## 2022-03-23 LAB — GLUCOSE, CAPILLARY: Glucose-Capillary: 104 mg/dL — ABNORMAL HIGH (ref 70–99)

## 2022-03-23 MED ORDER — FLUDEOXYGLUCOSE F - 18 (FDG) INJECTION
9.4400 | Freq: Once | INTRAVENOUS | Status: AC | PRN
  Administered 2022-03-23: 9.44 via INTRAVENOUS

## 2022-03-29 ENCOUNTER — Ambulatory Visit (INDEPENDENT_AMBULATORY_CARE_PROVIDER_SITE_OTHER): Payer: Medicare HMO | Admitting: Thoracic Surgery (Cardiothoracic Vascular Surgery)

## 2022-03-29 VITALS — BP 146/72 | HR 58 | Resp 20 | Ht 67.0 in | Wt 177.0 lb

## 2022-03-29 DIAGNOSIS — R911 Solitary pulmonary nodule: Secondary | ICD-10-CM | POA: Diagnosis not present

## 2022-03-29 DIAGNOSIS — R6889 Other general symptoms and signs: Secondary | ICD-10-CM | POA: Diagnosis not present

## 2022-03-29 NOTE — Progress Notes (Signed)
[address]       [place] [address]             [phone]     HPI: [name] returns to discuss the results of his PET/CT.  Terry Fitzgerald is an 87 year old man with a history of tobacco abuse, pulmonary fibrosis, MI, CAD, CABG, hypertension, PAD, bilateral carotid endarterectomies, rotted stent, and BPH.  He was found to have a left upper lobe lung nodule back in 2021.  Follow-up PET 6 months later showed the nodule had minimal activity and was smaller.  It eventually resolved.  Over time he was noted to have a new right upper lobe nodule.  Recently a CT showed an increase in size in the craniocaudal dimension.  I saw him in the office and recommended a PET/CT for further evaluation.  He has a 65-pack-year history of smoking.  He is quit a couple of weeks ago but is still smoking a cigarette from time to time.  Not having any anginal pain.  Does get short of breath with exertion.  No change in appetite or weight loss.  Past Medical History:  Diagnosis Date   BPH (benign prostatic hyperplasia)    Gout, unspecified    history x1   Hypertension    Myocardial infarction [facility]) 2011    Current Outpatient Medications  Medication Sig Dispense Refill   aspirin 325 MG tablet Take 325 mg by mouth every evening.     clopidogrel (PLAVIX) 75 MG tablet Take 75 mg by mouth daily.     finasteride (PROSCAR) 5 MG tablet Take 5 mg by mouth every evening.     losartan (COZAAR) 100 MG tablet Take 100 mg by mouth daily.     lovastatin (MEVACOR) 40 MG tablet Take 1 tablet (40 mg total) by mouth at bedtime. 90 tablet 2   nicotine (NICODERM CQ - DOSED IN MG/24 HOURS) 21 mg/24hr patch Place 1 patch (21 mg total) onto the skin daily. 42 patch 0   pantoprazole (PROTONIX) 40 MG tablet Take 40 mg by mouth daily.     tamsulosin (FLOMAX) 0.4 MG CAPS capsule Take 0.4 mg by mouth daily.     No current facility-administered medications for this visit.    Physical Exam BP (!) 146/72 (BP  Location: Left Arm, Patient Position: Sitting)   Pulse (!) 58   Resp 20   Ht 5' 7"$  (1.702 m)   Wt 177 lb (80.3 kg)   SpO2 95% Comment: RA  BMI 27.79 kg/m  87 year old man in no acute distress Alert and oriented x 3 with no focal deficit Lungs diminished breath sounds bilaterally  Diagnostic Tests: NUCLEAR MEDICINE PET SKULL BASE TO THIGH   TECHNIQUE: 9.4 mCi F-18 FDG was injected intravenously. Full-ring PET imaging was performed from the skull base to thigh after the radiotracer. CT data was obtained and used for attenuation correction and anatomic localization.   Fasting blood glucose: 104 mg/dl   COMPARISON:  CT chest dated 02/03/2022   FINDINGS: Mediastinal blood pool activity: SUV max 3.4   Liver activity: SUV max NA   NECK: No hypermetabolic cervical lymphadenopathy.   Incidental CT findings: None.   CHEST: Irregular, branching right upper lobe pulmonary nodule (series 7/images 15-19), measuring 2.0 cm on recent CT, max SUV 3.2.   11 mm short axis right paratracheal node (series 4/image 68), chronic and with some degree of normal fatty hilum (series 4/image 87), but with mild hypermetabolism, max SUV  3.7.   Incidental CT findings: Atherosclerotic calcifications of the aortic root and arch. Moderate three-vessel coronary atherosclerosis. Postsurgical changes related to prior CABG. Subpleural reticulation/fibrosis in the bilateral lower lobes. Mild paraseptal emphysematous changes.   ABDOMEN/PELVIS: No abnormal hypermetabolism in the liver, spleen, pancreas, or adrenal glands.   No hypermetabolic abdominopelvic lymphadenopathy.   Incidental CT findings: Bilateral renal cysts, including a 17 mm hyperdense anterior left upper pole renal cyst, benign (Bosniak II). Atherosclerotic calcifications of the abdominal aorta and branch vessels.   SKELETON: No focal hypermetabolic activity to suggest skeletal metastasis.   Incidental CT findings: Median sternotomy.  Degenerative changes of the thoracolumbar spine.   IMPRESSION: Hypermetabolic right upper lobe pulmonary nodule, suspicious for primary bronchogenic neoplasm.   11 mm short axis right paratracheal node is mildly hypermetabolic but chronic, favored to be reactive.   Otherwise, no findings suspicious for metastatic disease.     Electronically Signed   By: Julian Hy M.D.   On: 03/23/2022 12:32 I personally reviewed the PET/CT images.  There is some activity in the right upper lobe nodule.  Also increased activity in a right paratracheal node.  Impression: Terry Fitzgerald is an 87 year old man with a history of tobacco abuse, pulmonary fibrosis, MI, CAD, CABG, hypertension, PAD, bilateral carotid endarterectomies, rotted stent, and BPH.    Right upper lobe lung nodule-mild hypermetabolism on PET/CT.  Concerning for possible primary bronchogenic carcinoma.  Still could be infectious or inflammatory in nature, particularly given that he had a nodule on the left side that ultimately resolved.  However, given his history of tobacco abuse and progression of this nodule I think we are obligated to further investigate.  He also has some activity in the right paratracheal node.  This is favored by radiology to be reactive.  Unfortunately we cannot rely on that with certainty.  Also possible that it is involved in what ever process is causing the right upper lobe nodule.  It needs to be investigated as well.  I think the best option would be a navigational bronchoscopy and endobronchial ultrasound for diagnostic and staging purposes.  Reviewing the CT images, the nodule appears, for small airway adjacent to a much larger airways.  I think that would be very difficult to access reliably with standard navigational bronchoscopy.  I think there is a much better chance of success with a robotic bronchoscopy.  Will refer to pulmonology for consideration for robotic bronchoscopy and endobronchial  ultrasound.  I did discuss with him that if the nodule is cancerous but the node was negative, then surgery or radiation would be an option.  Given his age and comorbidities radiation is probably a better option for him.  However, if the node were also positive and he would need chemoradiation.  Plan: Refer to pulmonology for robotic bronchoscopy and endobronchial ultrasound.  I spent over 20 minutes in review of records, images, and in consultation with Mr. Marinez today. Terry Nakayama, MD [facility] [phone]

## 2022-03-29 NOTE — Progress Notes (Signed)
This

## 2022-03-31 ENCOUNTER — Telehealth: Payer: Self-pay | Admitting: Internal Medicine

## 2022-03-31 NOTE — Telephone Encounter (Signed)
No need for ov with me or any other pulmonary doc  unless the doc that gets the referral wants to see him first but he already had w/u and note from [place] who is making the referral.

## 2022-03-31 NOTE — Telephone Encounter (Signed)
PT is being referred for robotic bronch/EBUS. Pt is already established with Dr Melvyn Novas. LOV 03/01/22 with Katie Cobb. Please advise if pt needs consult with nodule docs or if he is okay to schedule procedure.

## 2022-03-31 NOTE — Telephone Encounter (Signed)
PT is being referred for robotic bronch/EBUS. Pt is already established with Dr Melvyn Novas. LOV 03/01/22 with Katie Cobb. Please advise if pt needs consult with nodule docs or if he is okay to schedule procedure.    Please advise sir

## 2022-04-01 NOTE — Telephone Encounter (Signed)
Patient is aware of below message and voiced his understanding.  Nothing further needed.   

## 2022-04-07 DIAGNOSIS — I7389 Other specified peripheral vascular diseases: Secondary | ICD-10-CM | POA: Diagnosis not present

## 2022-04-07 DIAGNOSIS — E782 Mixed hyperlipidemia: Secondary | ICD-10-CM | POA: Diagnosis not present

## 2022-04-07 DIAGNOSIS — R6889 Other general symptoms and signs: Secondary | ICD-10-CM | POA: Diagnosis not present

## 2022-04-07 DIAGNOSIS — N400 Enlarged prostate without lower urinary tract symptoms: Secondary | ICD-10-CM | POA: Diagnosis not present

## 2022-04-07 DIAGNOSIS — Z6828 Body mass index (BMI) 28.0-28.9, adult: Secondary | ICD-10-CM | POA: Diagnosis not present

## 2022-04-07 DIAGNOSIS — J440 Chronic obstructive pulmonary disease with acute lower respiratory infection: Secondary | ICD-10-CM | POA: Diagnosis not present

## 2022-04-07 DIAGNOSIS — I10 Essential (primary) hypertension: Secondary | ICD-10-CM | POA: Diagnosis not present

## 2022-04-07 DIAGNOSIS — M5459 Other low back pain: Secondary | ICD-10-CM | POA: Diagnosis not present

## 2022-04-22 ENCOUNTER — Telehealth: Payer: Self-pay | Admitting: Internal Medicine

## 2022-04-22 NOTE — Telephone Encounter (Signed)
PT daughter calling. Father has memory issues. Please see last phone encounter. hE DID NOT TRELL DAUGHTER ABOUT THIS CALL.  Daughter panicked because no one has called to sched th biopsy./ Pls call daughter @ [phone] and if poss flag chart about his memory issue (?). Thanks.

## 2022-04-22 NOTE — Telephone Encounter (Signed)
Yes fine with me 

## 2022-04-22 NOTE — Telephone Encounter (Signed)
Pt's OV that he had with Dr. Roxan Hockey stated that the plan was to refer patient to pulmonology for robotic bronchoscopy and endobronchial ultrasound. This was the reason why pt was referred to the office.  Pt is a current pt of Dr. Melvyn Novas.  Dr. Melvyn Novas, please advise if you would be okay if we got pt set up for a visit with either Dr. Valeta Harms or Dr. Lamonte Sakai to have this further discussed.

## 2022-04-22 NOTE — Telephone Encounter (Signed)
Called and spoke with pt's daughter and have scheduled pt to see Dr. Valeta Harms 3/14 at 10am. Nothing further needed.

## 2022-04-28 ENCOUNTER — Encounter: Payer: Self-pay | Admitting: Pulmonary Disease

## 2022-04-28 ENCOUNTER — Ambulatory Visit (INDEPENDENT_AMBULATORY_CARE_PROVIDER_SITE_OTHER): Payer: Medicare HMO | Admitting: Pulmonary Disease

## 2022-04-28 VITALS — BP 120/70 | HR 59 | Ht 67.0 in | Wt 175.8 lb

## 2022-04-28 DIAGNOSIS — R6889 Other general symptoms and signs: Secondary | ICD-10-CM | POA: Diagnosis not present

## 2022-04-28 DIAGNOSIS — R911 Solitary pulmonary nodule: Secondary | ICD-10-CM | POA: Diagnosis not present

## 2022-04-28 DIAGNOSIS — R599 Enlarged lymph nodes, unspecified: Secondary | ICD-10-CM

## 2022-04-28 NOTE — Progress Notes (Signed)
Synopsis: Referred in March 2024 for lung mass by Neale Burly, MD  Subjective:   PATIENT ID: Terry Fitzgerald GENDER: male DOB: 02/25/1935, MRN: [id number]  Chief Complaint  Patient presents with   Consult    Possible bronch    87 year old gentleman, past medical history of hypertension, gout, BPH, history of MI.  He also has history of coronary artery bypass grafting in 2011.  Presents with abnormal pet imaging for a right upper lobe pulmonary nodule and paratracheal lymph node that is hypermetabolic concerning for lung cancer.  He is a longstanding smoker has smoked since he was a teenager.  Now going on 60+ years.  Here today to discuss bronchoscopy and biopsy.     Past Medical History:  Diagnosis Date   BPH (benign prostatic hyperplasia)    Gout, unspecified    history x1   Hypertension    Myocardial infarction ([place]) 2011     No family history on file.   Past Surgical History:  Procedure Laterality Date   APPENDECTOMY     CATARACT EXTRACTION W/PHACO Right 05/27/2019   Procedure: CATARACT EXTRACTION PHACO AND INTRAOCULAR LENS PLACEMENT (IOC);  Surgeon: Baruch Goldmann, MD;  Location: [facility];  Service: Ophthalmology;  Laterality: Right;  CDE: 15.37   CATARACT EXTRACTION W/PHACO Left 06/14/2019   Procedure: CATARACT EXTRACTION PHACO AND INTRAOCULAR LENS PLACEMENT LEFT EYE;  Surgeon: Baruch Goldmann, MD;  Location: [facility];  Service: Ophthalmology;  Laterality: Left;  CDE: 20.79   CORONARY ARTERY BYPASS GRAFT     triple bypass 2011   HERNIA REPAIR     Rih   TONSILLECTOMY     TRANSCAROTID ARTERY REVASCULARIZATION  Right 10/25/2019   Procedure: RIGHT TRANSCAROTID ARTERY REVASCULARIZATION;  Surgeon: Marty Heck, MD;  Location: [place];  Service: Vascular;  Laterality: Right;   ULTRASOUND GUIDANCE FOR VASCULAR ACCESS Left 10/25/2019   Procedure: ULTRASOUND GUIDANCE FOR VASCULAR ACCESS;  Surgeon: Marty Heck, MD;  Location: [place];  Service: Vascular;  Laterality:  Left;    Social History   Socioeconomic History   Marital status: Widowed    Spouse name: Not on file   Number of children: Not on file   Years of education: Not on file   Highest education level: Not on file  Occupational History   Not on file  Tobacco Use   Smoking status: Every Day    Packs/day: 0.40    Years: 65.00    Additional pack years: 0.00    Total pack years: 26.00    Types: Cigarettes   Smokeless tobacco: Never   Tobacco comments:    Less than a 0.5ppd         HEI, 03/01/2022  Vaping Use   Vaping Use: Never used  Substance and Sexual Activity   Alcohol use: Not Currently   Drug use: Never   Sexual activity: Not Currently  Other Topics Concern   Not on file  Social History Narrative   Not on file   Social Determinants of Health   Financial Resource Strain: Not on file  Food Insecurity: Not on file  Transportation Needs: Not on file  Physical Activity: Not on file  Stress: Not on file  Social Connections: Not on file  Intimate Partner Violence: Not on file     Allergies  Allergen Reactions   Drug Class [Morphine And Related] Other (See Comments)    Hallucinations.     Outpatient Medications Prior to Visit  Medication Sig Dispense Refill  aspirin 325 MG tablet Take 325 mg by mouth every evening.     clopidogrel (PLAVIX) 75 MG tablet Take 75 mg by mouth daily.     finasteride (PROSCAR) 5 MG tablet Take 5 mg by mouth every evening.     losartan (COZAAR) 100 MG tablet Take 100 mg by mouth daily.     lovastatin (MEVACOR) 40 MG tablet Take 1 tablet (40 mg total) by mouth at bedtime. 90 tablet 2   nicotine (NICODERM CQ - DOSED IN MG/24 HOURS) 21 mg/24hr patch Place 1 patch (21 mg total) onto the skin daily. 42 patch 0   pantoprazole (PROTONIX) 40 MG tablet Take 40 mg by mouth daily.     tamsulosin (FLOMAX) 0.4 MG CAPS capsule Take 0.4 mg by mouth daily.     No facility-administered medications prior to visit.    Review of Systems  Constitutional:   Negative for chills, fever, malaise/fatigue and weight loss.  HENT:  Negative for hearing loss, sore throat and tinnitus.   Eyes:  Negative for blurred vision and double vision.  Respiratory:  Positive for cough and shortness of breath. Negative for hemoptysis, sputum production, wheezing and stridor.   Cardiovascular:  Negative for chest pain, palpitations, orthopnea, leg swelling and PND.  Gastrointestinal:  Negative for abdominal pain, constipation, diarrhea, heartburn, nausea and vomiting.  Genitourinary:  Negative for dysuria, hematuria and urgency.  Musculoskeletal:  Negative for joint pain and myalgias.  Skin:  Negative for itching and rash.  Neurological:  Negative for dizziness, tingling, weakness and headaches.  Endo/Heme/Allergies:  Negative for environmental allergies. Does not bruise/bleed easily.  Psychiatric/Behavioral:  Negative for depression. The patient is not nervous/anxious and does not have insomnia.   All other systems reviewed and are negative.    Objective:  Physical Exam Vitals reviewed.  Constitutional:      General: He is not in acute distress.    Appearance: He is well-developed.  HENT:     Head: Normocephalic and atraumatic.  Eyes:     General: No scleral icterus.    Conjunctiva/sclera: Conjunctivae normal.     Pupils: Pupils are equal, round, and reactive to light.  Neck:     Vascular: No JVD.     Trachea: No tracheal deviation.  Cardiovascular:     Rate and Rhythm: Normal rate and regular rhythm.     Heart sounds: Normal heart sounds. No murmur heard. Pulmonary:     Effort: Pulmonary effort is normal. No tachypnea, accessory muscle usage or respiratory distress.     Breath sounds: No stridor. No wheezing, rhonchi or rales.     Comments: Diminished breath sounds bilaterally Abdominal:     General: There is no distension.     Palpations: Abdomen is soft.     Tenderness: There is no abdominal tenderness.  Musculoskeletal:        General: No  tenderness.     Cervical back: Neck supple.  Lymphadenopathy:     Cervical: No cervical adenopathy.  Skin:    General: Skin is warm and dry.     Capillary Refill: Capillary refill takes less than 2 seconds.     Findings: No rash.  Neurological:     Mental Status: He is alert and oriented to person, place, and time.  Psychiatric:        Behavior: Behavior normal.      Vitals:   04/28/22 1005  BP: 120/70  Pulse: (!) 59  SpO2: 99%  Weight: 175 lb 12.8 oz (79.7  kg)  Height: '5\' 7"'$  (1.702 m)   99% on A BMI Readings from Last 3 Encounters:  04/28/22 27.53 kg/m  03/29/22 27.72 kg/m  03/08/22 27.37 kg/m   Wt Readings from Last 3 Encounters:  04/28/22 175 lb 12.8 oz (79.7 kg)  03/29/22 177 lb (80.3 kg)  03/08/22 180 lb (81.6 kg)     CBC    Component Value Date/Time   WBC 16.2 (H) 10/26/2019 0802   RBC 3.63 (L) 10/26/2019 0802   HGB 10.3 (L) 10/26/2019 0802   HCT 31.9 (L) 10/26/2019 0802   PLT 228 10/26/2019 0802   MCV 87.9 10/26/2019 0802   MCH 28.4 10/26/2019 0802   MCHC 32.3 10/26/2019 0802   RDW 12.9 10/26/2019 0802     Chest Imaging:  Nuclear medicine pet imaging 03/23/2022: Patient with a right upper lobe pulmonary nodule and small paratracheal node concerning for malignancy. The patient's images have been independently reviewed by me.    Pulmonary Functions Testing Results:    Latest Ref Rng & Units 03/01/2022    9:19 AM 05/16/2019    9:54 AM  PFT Results  FVC-Pre L 3.31  3.22   FVC-Predicted Pre % 98  92   FVC-Post L  3.62   FVC-Predicted Post %  103   Pre FEV1/FVC % % 77  76   Post FEV1/FCV % %  75   FEV1-Pre L 2.54  2.45   FEV1-Predicted Pre % 108  100   FEV1-Post L  2.71   DLCO uncorrected ml/min/mmHg 13.62  13.40   DLCO [facility]% % 62  60   DLCO corrected ml/min/mmHg 13.62    DLCO COR %Predicted % 62    DLVA Predicted % 65  64   TLC L  6.93   TLC % Predicted %  107   RV % Predicted %  145     FeNO:   Pathology:   Echocardiogram:    Heart Catheterization:     Assessment & Plan:     ICD-10-CM   1. Lung nodule  R91.1 Procedural/ Surgical Case Request: ROBOTIC ASSISTED NAVIGATIONAL BRONCHOSCOPY, VIDEO BRONCHOSCOPY WITH ENDOBRONCHIAL ULTRASOUND    Ambulatory referral to Pulmonology    2. Right upper lobe pulmonary nodule  R91.1     3. Adenopathy  R59.9       Discussion:  This is a 87 year old gentleman with a right upper lobe pulmonary nodule as well as adenopathy in the paratracheal region that is hypermetabolic on PET scan concerning for bronchogenic carcinoma.  Plan: Today in the office we talked about the risk benefits alternatives of proceeding robotics is navigational bronchoscopy tissue sampling followed by videobronchoscopy endobronchial ultrasound transbronchial needle aspiration. Patient is agreeable to proceed. He is on Plavix.  This will need to be held for a 5-day full washout prior to that.  We will have him drop his 325 mg aspirin to an 81 mg aspirin. He can restart his original regimen after the procedure.  Return to clinic in 1 week afterwards. Appointment to be scheduled with SG, NP.  Tentative bronchoscopy date on 05/10/2022    Current Outpatient Medications:    aspirin 325 MG tablet, Take 325 mg by mouth every evening., Disp: , Rfl:    clopidogrel (PLAVIX) 75 MG tablet, Take 75 mg by mouth daily., Disp: , Rfl:    finasteride (PROSCAR) 5 MG tablet, Take 5 mg by mouth every evening., Disp: , Rfl:    losartan (COZAAR) 100 MG tablet, Take 100 mg by mouth  daily., Disp: , Rfl:    lovastatin (MEVACOR) 40 MG tablet, Take 1 tablet (40 mg total) by mouth at bedtime., Disp: 90 tablet, Rfl: 2   nicotine (NICODERM CQ - DOSED IN MG/24 HOURS) 21 mg/24hr patch, Place 1 patch (21 mg total) onto the skin daily., Disp: 42 patch, Rfl: 0   pantoprazole (PROTONIX) 40 MG tablet, Take 40 mg by mouth daily., Disp: , Rfl:    tamsulosin (FLOMAX) 0.4 MG CAPS capsule, Take 0.4 mg by mouth daily., Disp: , Rfl:   I  spent 62 minutes dedicated to the care of this patient on the date of this encounter to include pre-visit review of records, face-to-face time with the patient discussing conditions above, post visit ordering of testing, clinical documentation with the electronic health record, making appropriate referrals as documented, and communicating necessary findings to members of the patients care team.   Garner Nash, DO [facility] 04/28/2022 10:35 AM

## 2022-04-28 NOTE — Patient Instructions (Signed)
Thank you for visiting Dr. Valeta Harms at [facility]. Today we recommend the following:  Orders Placed This Encounter  Procedures   Procedural/ Surgical Case Request: ROBOTIC ASSISTED NAVIGATIONAL BRONCHOSCOPY, VIDEO BRONCHOSCOPY WITH ENDOBRONCHIAL ULTRASOUND   Ambulatory referral to Pulmonology   Bronchoscopy on 05/10/2022  Return in about 19 days (around 05/17/2022) for w/ Eric Form, NP .    Please do your part to reduce the spread of COVID-19.

## 2022-04-28 NOTE — Addendum Note (Signed)
Addended by: Alvin Critchley on: 04/28/2022 11:39 AM   Modules accepted: Orders

## 2022-05-06 ENCOUNTER — Other Ambulatory Visit: Payer: Medicare HMO

## 2022-05-06 ENCOUNTER — Other Ambulatory Visit: Payer: Self-pay

## 2022-05-06 DIAGNOSIS — R911 Solitary pulmonary nodule: Secondary | ICD-10-CM

## 2022-05-06 DIAGNOSIS — R6889 Other general symptoms and signs: Secondary | ICD-10-CM | POA: Diagnosis not present

## 2022-05-08 LAB — NOVEL CORONAVIRUS, NAA: SARS-CoV-2, NAA: NOT DETECTED

## 2022-05-09 ENCOUNTER — Encounter (HOSPITAL_COMMUNITY): Payer: Self-pay | Admitting: Pulmonary Disease

## 2022-05-09 NOTE — Anesthesia Preprocedure Evaluation (Addendum)
Anesthesia Evaluation  Patient identified by MRN, date of birth, ID band Patient awake    Reviewed: Allergy & Precautions, NPO status , Patient's Chart, lab work & pertinent test results  History of Anesthesia Complications Negative for: history of anesthetic complications  Airway Mallampati: II  TM Distance: >3 FB Neck ROM: Full    Dental  (+) Dental Advisory Given   Pulmonary COPD,  COPD inhaler, Current Smoker and Patient abstained from smoking. Lung nodule   breath sounds clear to auscultation       Cardiovascular hypertension, Pt. on medications (-) angina + CAD, + Past MI, + CABG and + Peripheral Vascular Disease   Rhythm:Regular Rate:Normal  Pt denies any chest pain, heaviness, tightness at rest or exertion   Neuro/Psych negative neurological ROS     GI/Hepatic Neg liver ROS,GERD  Medicated and Controlled,,  Endo/Other  negative endocrine ROS    Renal/GU negative Renal ROS     Musculoskeletal   Abdominal   Peds  Hematology plavix   Anesthesia Other Findings   Reproductive/Obstetrics                             Anesthesia Physical Anesthesia Plan  ASA: 3  Anesthesia Plan: General   Post-op Pain Management: Tylenol PO (pre-op)*   Induction: Intravenous  PONV Risk Score and Plan: 1 and Ondansetron and Treatment may vary due to age or medical condition  Airway Management Planned: Oral ETT  Additional Equipment: None  Intra-op Plan:   Post-operative Plan: Extubation in OR  Informed Consent: I have reviewed the patients History and Physical, chart, labs and discussed the procedure including the risks, benefits and alternatives for the proposed anesthesia with the patient or authorized representative who has indicated his/her understanding and acceptance.       Plan Discussed with: CRNA and Surgeon  Anesthesia Plan Comments: (See APP note by Durel Salts, FNP   Pt  cancelled in pre-op by Dr Valeta Harms.  Pt took plavix this am)       Anesthesia Quick Evaluation

## 2022-05-09 NOTE — Progress Notes (Signed)
Anesthesia Chart Review:  Pt is a same day work up   Case: [id number] Date/Time: 05/10/22 0915   Procedures:      ROBOTIC ASSISTED NAVIGATIONAL BRONCHOSCOPY (Right) - ION w/ CIOS     VIDEO BRONCHOSCOPY WITH ENDOBRONCHIAL ULTRASOUND (Bilateral)   Anesthesia type: General   Diagnosis: Lung nodule [R91.1]   Pre-op diagnosis: lung nodule, adenopathy   Location: [facility] 3 / [place] [facility]   Surgeons: Garner Nash, DO       DISCUSSION: Pt is 87 years old with hx:  - CAD (s/p CABG 2010 in [place]; 2022 cardiology notes document "cath 12/2010: mild diffuse LM disease, occluded LCX, occluded RCA. Patent LIMA-LAD patent, patent SVG-OM, patent SVG-PDA" and pt "report some stents after bypass 8-10 years ago") - carotid artery disease (s/p R TCAR 2021, s/p prior B CEA and R carotid stent) - PAD (s/p L common iliac artery angioplasty with stent placement 2010 for claudication; s/p L SFA angioplasty 2013).  - HTN - 65 pack year smoking history.    PROVIDERS: - PCP is Neale Burly, MD - Cardiologist is Carlyle Dolly, MD. Last office visit 04/03/20.     LABS: Will be obtained day of surgery   IMAGES: PET scan skull to thigh [id number]:  - Hypermetabolic right upper lobe pulmonary nodule, suspicious for primary bronchogenic neoplasm. - 11 mm short axis right paratracheal node is mildly hypermetabolic but chronic, favored to be reactive. - Otherwise, no findings suspicious for metastatic disease.  CT super D chest 02/03/22:  1. Elongated, irregular nodule of the right upper lobe, which is predominantly oriented craniocaudally, measuring 2.0 x 0.7 x 0.6 cm.  This previously measured 1.4 x 0.7 x 0.6 cm on examination dated 07/27/2021 and 0.7 x 0.4 x 0.3 cm on examination dated 07/15/2020. Continued enlargement over time is highly concerning for primary lung malignancy. 2. Mild emphysema and diffuse bilateral bronchial wall thickening. 3. Mild pulmonary fibrosis in a pattern with  apical to basal gradient, featuring irregular peripheral interstitial opacity, septal thickening, and a preponderance of ground-glass of the bilateral lung bases, however without clear evidence of subpleural bronchiolectasis or honeycombing.  4. Multiple bilateral exophytic renal lesions of varying attenuation, incompletely characterized by noncontrast CT of the chest. Consider dedicated imaging of the abdomen and pelvis with and without contrast 4 further evaluation. 5. Coronary artery disease.   EKG: Will be obtained day of surgery    CV: None available. Reportedly had cath in 2012   Past Medical History:  Diagnosis Date   BPH (benign prostatic hyperplasia)    Carotid artery disease ([facility])    S/p B endarterectomies, s/p R carotid stent. S/p R TCAR 2021   Coronary artery disease    s/p CABG 2010   Gout, unspecified    history x1   Hypertension    Myocardial infarction [facility]) 2011   Peripheral artery disease ([place])    s/p L common iliac artery angioplasty with stent placement 2010 for claudication; s/p L SFA angioplasty 2013    Past Surgical History:  Procedure Laterality Date   APPENDECTOMY     CATARACT EXTRACTION W/PHACO Right 05/27/2019   Procedure: CATARACT EXTRACTION PHACO AND INTRAOCULAR LENS PLACEMENT ([place]);  Surgeon: Baruch Goldmann, MD;  Location: [facility];  Service: Ophthalmology;  Laterality: Right;  CDE: 15.37   CATARACT EXTRACTION W/PHACO Left 06/14/2019   Procedure: CATARACT EXTRACTION PHACO AND INTRAOCULAR LENS PLACEMENT LEFT EYE;  Surgeon: Baruch Goldmann, MD;  Location: [facility];  Service: Ophthalmology;  Laterality: Left;  CDE: 20.79   CORONARY ARTERY BYPASS GRAFT     triple bypass 2011   HERNIA REPAIR     Rih   TONSILLECTOMY     TRANSCAROTID ARTERY REVASCULARIZATION  Right 10/25/2019   Procedure: RIGHT TRANSCAROTID ARTERY REVASCULARIZATION;  Surgeon: Marty Heck, MD;  Location: [place];  Service: Vascular;  Laterality: Right;   ULTRASOUND GUIDANCE FOR VASCULAR  ACCESS Left 10/25/2019   Procedure: ULTRASOUND GUIDANCE FOR VASCULAR ACCESS;  Surgeon: Marty Heck, MD;  Location: [place];  Service: Vascular;  Laterality: Left;    MEDICATIONS: No current facility-administered medications for this encounter.    albuterol (ACCUNEB) 1.25 MG/3ML nebulizer solution   aspirin 325 MG tablet   clopidogrel (PLAVIX) 75 MG tablet   finasteride (PROSCAR) 5 MG tablet   losartan (COZAAR) 100 MG tablet   lovastatin (MEVACOR) 20 MG tablet   lovastatin (MEVACOR) 40 MG tablet   nicotine (NICODERM CQ - DOSED IN MG/24 HOURS) 21 mg/24hr patch   pantoprazole (PROTONIX) 40 MG tablet   tamsulosin (FLOMAX) 0.4 MG CAPS capsule    Pt with significant CV hx and no recent cardiology evaluation. Was seen by Modesto Charon, MD with CT surgery 03/08/22 and at that time pt denied chest pain, pressure, or tightness but does get SOB with yard work but not at rest; Dr. Leonarda Salon note 03/29/22 again documents "not having any anginal pain. Does get short of breath with exertion"  Pt will need further evaluation by assigned anesthesiologist day of surgery. If labs and EKG acceptable day of surgery, and pt without acute symptoms, I anticipate pt can proceed with surgery as scheduled.   Willeen Cass, PhD, FNP-BC [facility]/Anesthesiology Phone: [phone] 05/09/2022 12:28 PM

## 2022-05-10 ENCOUNTER — Ambulatory Visit (HOSPITAL_COMMUNITY): Payer: Medicare HMO | Admitting: Anesthesiology

## 2022-05-10 ENCOUNTER — Ambulatory Visit (HOSPITAL_COMMUNITY): Payer: Medicare HMO

## 2022-05-10 ENCOUNTER — Encounter (HOSPITAL_COMMUNITY): Payer: Self-pay | Admitting: Pulmonary Disease

## 2022-05-10 ENCOUNTER — Encounter: Payer: Self-pay | Admitting: Pulmonary Disease

## 2022-05-10 ENCOUNTER — Telehealth: Payer: Self-pay | Admitting: Pulmonary Disease

## 2022-05-10 ENCOUNTER — Encounter (HOSPITAL_COMMUNITY)
Admission: RE | Disposition: A | Discharge status: Home / Self Care | Payer: Self-pay | Source: Home / Self Care | Attending: Pulmonary Disease

## 2022-05-10 ENCOUNTER — Ambulatory Visit (HOSPITAL_COMMUNITY)
Admission: RE | Admit: 2022-05-10 | Discharge: 2022-05-10 | Disposition: A | Discharge status: Home / Self Care | Payer: Medicare HMO | Attending: Pulmonary Disease | Admitting: Pulmonary Disease

## 2022-05-10 DIAGNOSIS — I739 Peripheral vascular disease, unspecified: Secondary | ICD-10-CM | POA: Diagnosis not present

## 2022-05-10 DIAGNOSIS — Z7902 Long term (current) use of antithrombotics/antiplatelets: Secondary | ICD-10-CM | POA: Insufficient documentation

## 2022-05-10 DIAGNOSIS — R911 Solitary pulmonary nodule: Secondary | ICD-10-CM | POA: Diagnosis not present

## 2022-05-10 DIAGNOSIS — J449 Chronic obstructive pulmonary disease, unspecified: Secondary | ICD-10-CM | POA: Insufficient documentation

## 2022-05-10 DIAGNOSIS — Z951 Presence of aortocoronary bypass graft: Secondary | ICD-10-CM | POA: Diagnosis not present

## 2022-05-10 DIAGNOSIS — K219 Gastro-esophageal reflux disease without esophagitis: Secondary | ICD-10-CM | POA: Diagnosis not present

## 2022-05-10 DIAGNOSIS — I252 Old myocardial infarction: Secondary | ICD-10-CM | POA: Diagnosis not present

## 2022-05-10 DIAGNOSIS — I4510 Unspecified right bundle-branch block: Secondary | ICD-10-CM | POA: Diagnosis not present

## 2022-05-10 DIAGNOSIS — I10 Essential (primary) hypertension: Secondary | ICD-10-CM | POA: Insufficient documentation

## 2022-05-10 DIAGNOSIS — R6889 Other general symptoms and signs: Secondary | ICD-10-CM | POA: Diagnosis not present

## 2022-05-10 DIAGNOSIS — I2510 Atherosclerotic heart disease of native coronary artery without angina pectoris: Secondary | ICD-10-CM | POA: Insufficient documentation

## 2022-05-10 DIAGNOSIS — Z539 Procedure and treatment not carried out, unspecified reason: Secondary | ICD-10-CM | POA: Insufficient documentation

## 2022-05-10 DIAGNOSIS — F17200 Nicotine dependence, unspecified, uncomplicated: Secondary | ICD-10-CM | POA: Insufficient documentation

## 2022-05-10 HISTORY — DX: Peripheral vascular disease, unspecified: I73.9

## 2022-05-10 HISTORY — DX: Atherosclerotic heart disease of native coronary artery without angina pectoris: I25.10

## 2022-05-10 HISTORY — DX: Disorder of arteries and arterioles, unspecified: I77.9

## 2022-05-10 LAB — CBC
HCT: 34.7 % — ABNORMAL LOW (ref 39.0–52.0)
Hemoglobin: 11.4 g/dL — ABNORMAL LOW (ref 13.0–17.0)
MCH: 28.8 pg (ref 26.0–34.0)
MCHC: 32.9 g/dL (ref 30.0–36.0)
MCV: 87.6 fL (ref 80.0–100.0)
Platelets: 261 10*3/uL (ref 150–400)
RBC: 3.96 MIL/uL — ABNORMAL LOW (ref 4.22–5.81)
RDW: 13.1 % (ref 11.5–15.5)
WBC: 7.3 10*3/uL (ref 4.0–10.5)
nRBC: 0 % (ref 0.0–0.2)

## 2022-05-10 LAB — BASIC METABOLIC PANEL
Anion gap: 12 (ref 5–15)
BUN: 15 mg/dL (ref 8–23)
CO2: 20 mmol/L — ABNORMAL LOW (ref 22–32)
Calcium: 9.2 mg/dL (ref 8.9–10.3)
Chloride: 101 mmol/L (ref 98–111)
Creatinine, Ser: 1.13 mg/dL (ref 0.61–1.24)
GFR, Estimated: >60 mL/min (ref >60)
Glucose, Bld: 94 mg/dL (ref 70–99)
Potassium: 4.3 mmol/L (ref 3.5–5.1)
Sodium: 133 mmol/L — ABNORMAL LOW (ref 135–145)

## 2022-05-10 SURGERY — CANCELLED PROCEDURE
Anesthesia: General | Laterality: Right

## 2022-05-10 MED ORDER — CHLORHEXIDINE GLUCONATE 0.12 % MT SOLN
15.0000 mL | OROMUCOSAL | Status: AC
  Administered 2022-05-10: 15 mL via OROMUCOSAL
  Filled 2022-05-10 (×2): qty 15

## 2022-05-10 MED ORDER — ACETAMINOPHEN 500 MG PO TABS
1000.0000 mg | ORAL_TABLET | Freq: Once | ORAL | Status: AC
  Administered 2022-05-10: 1000 mg via ORAL
  Filled 2022-05-10: qty 2

## 2022-05-10 MED ORDER — LACTATED RINGERS IV SOLN: INTRAVENOUS | Status: DC

## 2022-05-10 SURGICAL SUPPLY — 28 items
BRUSH CYTOL CELLEBRITY 1.5X140 (MISCELLANEOUS) IMPLANT
CANISTER SUCT 3000ML PPV (MISCELLANEOUS) ×3 IMPLANT
CONT SPEC 4OZ CLIKSEAL STRL BL (MISCELLANEOUS) ×3 IMPLANT
COVER BACK TABLE 60X90IN (DRAPES) ×3 IMPLANT
COVER DOME SNAP 22 D (MISCELLANEOUS) ×3 IMPLANT
FORCEPS BIOP RJ4 1.8 (CUTTING FORCEPS) IMPLANT
GAUZE SPONGE 4X4 12PLY STRL (GAUZE/BANDAGES/DRESSINGS) ×3 IMPLANT
GLOVE BIO SURGEON STRL SZ7.5 (GLOVE) ×3 IMPLANT
GOWN STRL REUS W/ TWL LRG LVL3 (GOWN DISPOSABLE) ×3 IMPLANT
GOWN STRL REUS W/TWL LRG LVL3 (GOWN DISPOSABLE) ×3
KIT CLEAN ENDO COMPLIANCE (KITS) ×6 IMPLANT
KIT TURNOVER KIT B (KITS) ×3 IMPLANT
MARKER SKIN DUAL TIP RULER LAB (MISCELLANEOUS) ×3 IMPLANT
NEEDLE EBUS SONO TIP PENTAX (NEEDLE) ×3 IMPLANT
NS IRRIG 1000ML POUR BTL (IV SOLUTION) ×3 IMPLANT
OIL SILICONE PENTAX (PARTS (SERVICE/REPAIRS)) ×3 IMPLANT
PAD ARMBOARD 7.5X6 YLW CONV (MISCELLANEOUS) ×6 IMPLANT
SOL ANTI FOG 6CC (MISCELLANEOUS) ×3 IMPLANT
SYR 20CC LL (SYRINGE) ×6 IMPLANT
SYR 20ML ECCENTRIC (SYRINGE) ×6 IMPLANT
SYR 50ML SLIP (SYRINGE) IMPLANT
SYR 5ML LUER SLIP (SYRINGE) ×3 IMPLANT
TOWEL OR 17X24 6PK STRL BLUE (TOWEL DISPOSABLE) ×3 IMPLANT
TRAP SPECIMEN MUCOUS 40CC (MISCELLANEOUS) IMPLANT
TUBE CONNECTING 20X1/4 (TUBING) ×6 IMPLANT
UNDERPAD 30X30 (UNDERPADS AND DIAPERS) ×3 IMPLANT
VALVE DISPOSABLE (MISCELLANEOUS) ×3 IMPLANT
WATER STERILE IRR 1000ML POUR (IV SOLUTION) ×3 IMPLANT

## 2022-05-10 NOTE — Progress Notes (Signed)
Patient dc'd to home. Escorted patient out to transportation [place].

## 2022-05-10 NOTE — H&P (Signed)
Case was cancelled as patient took his plavix.   We will reschedule  Terry Nash, DO [facility] 05/10/2022 11:04 AM

## 2022-05-10 NOTE — Progress Notes (Signed)
MD and anesthesia made aware of Plavix taken yesterday. Called and confirmed last dose with patient's daughter, Fitzke. MD cancelling procedure at this time. [facility] and patient made aware that Plavix would have to be stopped one week prior to procedure once rescheduled. Verbalized understanding. Patient's daughter stated she would call for a ride to pick up patient at this time.

## 2022-05-10 NOTE — Telephone Encounter (Signed)
Patient's daughter called to leave her email address so that the notes could be sent:  [email or web address]

## 2022-05-10 NOTE — Telephone Encounter (Signed)
Pt had procedure sch. for today but pt did not stop plavix as stated on instruction letter. Pt's daughter would like office notes moving forward with father's care.

## 2022-05-10 NOTE — Telephone Encounter (Signed)
Patient's daughter called to request that she get a copy of patient's office notes going forward because she said her father forgets to give her the notes and she needs to know to manage his appts.  Please advise and call to discuss.

## 2022-05-17 ENCOUNTER — Ambulatory Visit: Payer: Medicare HMO | Admitting: Acute Care

## 2022-05-27 ENCOUNTER — Other Ambulatory Visit: Payer: Self-pay | Admitting: Pulmonary Disease

## 2022-05-27 ENCOUNTER — Other Ambulatory Visit: Payer: Medicare HMO

## 2022-05-27 DIAGNOSIS — R6889 Other general symptoms and signs: Secondary | ICD-10-CM | POA: Diagnosis not present

## 2022-05-27 DIAGNOSIS — Z01812 Encounter for preprocedural laboratory examination: Secondary | ICD-10-CM

## 2022-05-29 LAB — NOVEL CORONAVIRUS, NAA: SARS-CoV-2, NAA: NOT DETECTED

## 2022-05-30 ENCOUNTER — Encounter (HOSPITAL_COMMUNITY): Payer: Self-pay | Admitting: Pulmonary Disease

## 2022-05-30 NOTE — Progress Notes (Signed)
SDW call  Patient was given pre-op instructions over the phone. Patient verbalized understanding of instructions provided.     PCP - Dr. Horris Latino  PPM/ICD - Denies   Chest x-ray -  EKG -  05/10/2022 Stress Test - ECHO -  Cardiac Cath -2012   Sleep Study/sleep apnea/CPAP: Denies  Non-diabetic   Blood Thinner Instructions: Has not taken since 05/10/2022 when surgery was cancelled Aspirin Instructions: Hold day of surgery   ERAS Protcol - No, NPO PRE-SURGERY Ensure or G2- No   COVID TEST- Negative 05/27/2022     Anesthesia review: Reviewed 05/09/2022. No changes since then   Patient denies shortness of breath, fever, cough and chest pain over the phone call    Your procedure is scheduled on Tuesday May 31, 2022  Report to [facility] Main Entrance "A" at  0645  A.M., then check in with the Admitting office.  Call this number if you have problems the morning of surgery:  [phone]   If you have any questions prior to your surgery date call [phone]: Open Monday-Friday 8am-4pm If you experience any cold or flu symptoms such as cough, fever, chills, shortness of breath, etc. between now and your scheduled surgery, please notify us at the above number     Remember:  Do not eat or drink after midnight the night before your surgery   Take these medicines the morning of surgery with A SIP OF WATER:  Protonix, flomax, lovastatin  As needed: albuterol  As of today, STOP taking any Aspirin (unless otherwise instructed by your surgeon) Aleve, Naproxen, Ibuprofen, Motrin, Advil, Goody's, BC's, all herbal medications, fish oil, and all vitamins.

## 2022-05-31 ENCOUNTER — Ambulatory Visit (HOSPITAL_COMMUNITY): Admission: RE | Admit: 2022-05-31 | Payer: Medicare HMO | Source: Ambulatory Visit | Admitting: Pulmonary Disease

## 2022-05-31 ENCOUNTER — Encounter (HOSPITAL_COMMUNITY): Payer: Self-pay | Admitting: Certified Registered Nurse Anesthetist

## 2022-05-31 ENCOUNTER — Telehealth: Payer: Self-pay | Admitting: Pulmonary Disease

## 2022-05-31 SURGERY — BRONCHOSCOPY, WITH BIOPSY USING ELECTROMAGNETIC NAVIGATION
Anesthesia: General | Laterality: Right

## 2022-05-31 NOTE — Telephone Encounter (Signed)
Spoke to pt. Daughter pt. Is sick and did not go to procedure this morning his phone is not work if we reach out to the daughter

## 2022-05-31 NOTE — Telephone Encounter (Signed)
Tried to call pt's dtr to let her know pt can keep appt with Maralyn Sago on 4/23 per BI and he can discuss with Maralyn Sago what he is to do as far as follow up.  VM has not been set up so was unable to leave a message.

## 2022-06-01 NOTE — Telephone Encounter (Signed)
Pt's dtr called me back.  Explained they could keep appt with Maralyn Sago on Tuesday to discuss next steps.  She asked about rescheduling the bronch and I ended up explaining BI states would not reschedule thru [facility].  She asked what that meant and I told her he could be referred to [facility] or [facility].  Dtr stated he forgot and took Plavix first time and had bad cold this time.  I told her we were not notified and BI was at hospital ready for procedure - pt did not let anyone know he would not be there.  She stated ok and she would come with him to appt on Tuesday with SG and discuss getting referral to another location.

## 2022-06-07 ENCOUNTER — Ambulatory Visit: Payer: Medicare HMO | Admitting: Acute Care

## 2022-06-08 DIAGNOSIS — R6889 Other general symptoms and signs: Secondary | ICD-10-CM | POA: Diagnosis not present

## 2022-06-24 ENCOUNTER — Encounter: Payer: Self-pay | Admitting: Acute Care

## 2022-06-24 ENCOUNTER — Ambulatory Visit (INDEPENDENT_AMBULATORY_CARE_PROVIDER_SITE_OTHER): Payer: Medicare HMO | Admitting: Acute Care

## 2022-06-24 VITALS — BP 112/62 | HR 62 | Temp 97.5°F | Ht 67.0 in | Wt 173.0 lb

## 2022-06-24 DIAGNOSIS — F17200 Nicotine dependence, unspecified, uncomplicated: Secondary | ICD-10-CM

## 2022-06-24 DIAGNOSIS — R911 Solitary pulmonary nodule: Secondary | ICD-10-CM | POA: Diagnosis not present

## 2022-06-24 DIAGNOSIS — R6889 Other general symptoms and signs: Secondary | ICD-10-CM | POA: Diagnosis not present

## 2022-06-24 DIAGNOSIS — I2510 Atherosclerotic heart disease of native coronary artery without angina pectoris: Secondary | ICD-10-CM | POA: Diagnosis not present

## 2022-06-24 NOTE — Patient Instructions (Signed)
It is good to see you today. You need a biopsy of the right upper lobe nodule. I will reach out to Social Work to see if they can help to get transportation arranged.  You will need a family member to stay with you during the procedure, because you will have just had general anesthesia.  We will call you and let you know if we can get help with transportation, and then we will schedule the procedure.  Call us if you have any questions  Please contact office for sooner follow up if symptoms do not improve or worsen or seek emergency care

## 2022-06-24 NOTE — H&P (View-Only) (Signed)
History of Present Illness Terry Fitzgerald is a 87 y.o. male active smoker ( 60 + pack year smoking history) followed for solitary right upper lobe pulmonary nodule and paratracheal lymph node that is hypermetabolic on PET and  concerning   Past medical history significant for PAD, carotid artery disease, CAD status post CABG, hypertension, history of MI, BPH, gout. He is a patient Terry Fitzgerald, was referred to Terry Fitzgerald for biopsy of the lung nodule of concern.  Synopsis 87 year old gentleman, past medical history of hypertension, gout, BPH, history of MI. He also has history of coronary artery bypass grafting in 2011. Presents with abnormal pet imaging for a right upper lobe pulmonary nodule and paratracheal lymph node that is hypermetabolic concerning for lung cancer. He is a longstanding smoker has smoked since he was a teenager. Now going on 60+ years.    06/28/2022 Pt. Presents for follow up. Pt. Was initially seen by Terry Fitzgerald 04/28/2022. Plan was for bronchoscopy on 05/10/2022. Patient presented but he had taken his blood thinner and procedure was cancelled and rescheduled for 05/31/2022. Patient was a no show on that date. He is here today with the intention of getting him referred for biopsy to another medical center, as we have had 2 procedure cancellations.  When I started talking with the patient , he stated that he had no transportation. As a result, we cannot refer him to a more distant center. I spoke with Terry Fitzgerald. He is willing to give this a try one more time. Per the patient he has a cousin that is more reliable than his daughter. She may be a better option to help get him to the hospital, and stay during the procedure. Oncology nurse navigation did reach out and patient can get transportation through his insurance, but he will need someone with him from beginning to until after discharge for the procedure day. I will check with Terry Fitzgerald on when he feels he can get the patient  scheduled. We will let the patient know once we have scheduling options.   He is on Plavix.  This will need to be held for a 5-day full washout prior to that.  We will have him drop his 325 mg aspirin to an 81 mg aspirin. He can restart his original regimen after the procedure.  Test Results: Nuclear medicine pet imaging 03/23/2022: Patient with a right upper lobe pulmonary nodule and small paratracheal node concerning for malignancy. The patient's images have been independently reviewed by me.   05/16/2019 PFT: FVC 92, FEV1 100, ratio 75, TLC 107, DLCOunc 60.  Positive BD 02/03/2022 super D CT chest: Atherosclerosis.  Status post CABG.  No LAD.  Elongated, irregular nodule of the right upper lobe now measuring 2 x 0.7 x 0.6 cm, previously 1.4 x 0.7 x 0.6 cm June 2023.  Mild centrilobular and paraseptal emphysema.  Diffuse bilateral bronchial wall thickening.  Mild pulmonary fibrosis; not UIP.  Multiple bilateral exophytic renal lesions of varying attenuation, incompletely characterized 03/01/2022 PFT: FVC 98, FEV1 108, ratio 77, DLCO 62    Latest Ref Rng & Units 05/10/2022    7:15 AM 10/26/2019    8:02 AM 10/23/2019    1:00 PM  CBC  WBC 4.0 - 10.5 K/uL 7.3  16.2  8.7   Hemoglobin 13.0 - 17.0 g/dL 16.1  09.6  04.5   Hematocrit 39.0 - 52.0 % 34.7  31.9  41.4   Platelets 150 - 400 K/uL 261  228  222        Latest Ref Rng & Units 05/10/2022    7:15 AM 10/26/2019    8:02 AM 10/23/2019    1:00 PM  BMP  Glucose 70 - 99 mg/dL 94  409  87   BUN 8 - 23 mg/dL 15  20  17    Creatinine 0.61 - 1.24 mg/dL 8.11  9.14  7.82   Sodium 135 - 145 mmol/L 133  133  133   Potassium 3.5 - 5.1 mmol/L 4.3  4.7  5.0   Chloride 98 - 111 mmol/L 101  104  101   CO2 22 - 32 mmol/L 20  20  21    Calcium 8.9 - 10.3 mg/dL 9.2  8.9  9.7     BNP No results found for: "BNP"  ProBNP No results found for: "PROBNP"  PFT    Component Value Date/Time   FEV1PRE 2.54 03/01/2022 0919   FEV1POST 2.71 05/16/2019 0954   FVCPRE  3.31 03/01/2022 0919   FVCPOST 3.62 05/16/2019 0954   TLC 6.93 05/16/2019 0954   DLCOUNC 13.62 03/01/2022 0919   PREFEV1FVCRT 77 03/01/2022 0919   PSTFEV1FVCRT 75 05/16/2019 0954    No results found.   Past medical hx Past Medical History:  Diagnosis Date   BPH (benign prostatic hyperplasia)    Carotid artery disease (HCC)    S/p B endarterectomies, s/p R carotid stent. S/p R TCAR 2021   Coronary artery disease    s/p CABG 2010   Gout, unspecified    history x1   Hypertension    Myocardial infarction [facility]) 2011   Peripheral artery disease (HCC)    s/p L common iliac artery angioplasty with stent placement 2010 for claudication; s/p L SFA angioplasty 2013     Social History   Tobacco Use   Smoking status: Every Day    Packs/day: 0.40    Years: 65.00    Additional pack years: 0.00    Total pack years: 26.00    Types: Cigarettes   Smokeless tobacco: Never   Tobacco comments:    Less than a 0.5ppd         HEI, 03/01/2022  Vaping Use   Vaping Use: Never used  Substance Use Topics   Alcohol use: Not Currently   Drug use: Never    Terry Fitzgerald reports that he has been smoking cigarettes. He has a 26.00 pack-year smoking history. He has never used smokeless tobacco. He reports that he does not currently use alcohol. He reports that he does not use drugs.  Tobacco Cessation: Current every day smoker, working on quitting, but still smoking about half a pack per day  Past surgical hx, Family hx, Social hx all reviewed.  Current Outpatient Medications on File Prior to Visit  Medication Sig   albuterol (ACCUNEB) 1.25 MG/3ML nebulizer solution Take 1.25 mg by nebulization every 6 (six) hours as needed for shortness of breath or wheezing.   aspirin 325 MG tablet Take 325 mg by mouth every evening.   clopidogrel (PLAVIX) 75 MG tablet Take 75 mg by mouth daily.   finasteride (PROSCAR) 5 MG tablet Take 5 mg by mouth every evening.   losartan (COZAAR) 100 MG tablet Take 100 mg  by mouth daily.   lovastatin (MEVACOR) 20 MG tablet    lovastatin (MEVACOR) 40 MG tablet Take 1 tablet (40 mg total) by mouth at bedtime.   pantoprazole (PROTONIX) 40 MG tablet Take 40 mg by mouth daily.   tamsulosin (FLOMAX)  0.4 MG CAPS capsule Take 0.4 mg by mouth daily.   nicotine (NICODERM CQ - DOSED IN MG/24 HOURS) 21 mg/24hr patch Place 1 patch (21 mg total) onto the skin daily. (Patient not taking: Reported on 06/24/2022)   No current facility-administered medications on file prior to visit.     Allergies  Allergen Reactions   Drug Class [Morphine And Related] Other (See Comments)    Hallucinations.    Review Of Systems:  Constitutional:   No  weight loss, night sweats,  Fevers, chills, fatigue, or  lassitude.  HEENT:   No headaches,  Difficulty swallowing,  Tooth/dental problems, or  Sore throat,                No sneezing, itching, ear ache, nasal congestion, post nasal drip,   CV:  No chest pain,  Orthopnea, PND, swelling in lower extremities, anasarca, dizziness, palpitations, syncope.   GI  No heartburn, indigestion, abdominal pain, nausea, vomiting, diarrhea, change in bowel habits, loss of appetite, bloody stools.   Resp: No shortness of breath with exertion or at rest.  No excess mucus, no productive cough,  No non-productive cough,  No coughing up of blood.  No change in color of mucus.  No wheezing.  No chest wall deformity  Skin: no rash or lesions.  GU: no dysuria, change in color of urine, no urgency or frequency.  No flank pain, no hematuria   MS:  No joint pain or swelling.  No decreased range of motion.  No back pain.  Psych:  No change in mood or affect. No depression or anxiety.  No memory loss.   Vital Signs BP 112/62 (BP Location: Left Arm, Patient Position: Sitting, Cuff Size: Normal)   Pulse 62   Temp (!) 97.5 F (36.4 C) (Oral)   Ht 5\' 7"  (1.702 m)   Wt 173 lb (78.5 kg)   SpO2 98%   BMI 27.10 kg/m    Physical Exam:  General- No  distress,  A&Ox3, pleasant  ENT: No sinus tenderness, TM clear, pale nasal mucosa, no oral exudate,no post nasal drip, no LAN Cardiac: S1, S2, regular rate and rhythm, no murmur Chest: + wheeze/ No rales/ dullness; no accessory muscle use, no nasal flaring, no sternal retractions, diminished per bases Abd.: Soft Non-tender, ND, BS +, Body mass index is 27.1 kg/m.  Ext: No clubbing cyanosis, edema Neuro:  normal strength, MAE x 4, A&O x 3 Skin: No rashes, warm and dry Psych: normal mood and behavior   Assessment/Plan Right upper lobe pulmonary nodule and paratracheal lymph node that is hypermetabolic concerning for lung cancer on PET. Current every day smoker  Has missed 2 scheduled bronchoscopies, one due to failure to hold his blood thinner, and one as a no show There are transportation issues Plan You need a biopsy of the right upper lobe nodule. I will reach out to Social Work to see if they can help to get transportation arranged.  You will need a family member to stay with you during the procedure, because you will have just had general anesthesia.  We will call you and let you know if we can get help with transportation, and then we will schedule the procedure.  Call us if you have any questions  Work on quitting smoking Please contact office for sooner follow up if symptoms do not improve or worsen or seek emergency care    I spent 35 minutes dedicated to the care of this patient on the  date of this encounter to include pre-visit review of records, face-to-face time with the patient discussing conditions above, post visit ordering of testing, clinical documentation with the electronic health record, making appropriate referrals as documented, and communicating necessary information to the patient's healthcare team.    Bevelyn Ngo, NP 06/28/2022  2:27 PM

## 2022-06-24 NOTE — Progress Notes (Unsigned)
History of Present Illness Terry Fitzgerald is a 87 y.o. male active smoker ( 60 + pack year smoking history) followed for solitary right upper lobe pulmonary nodule and paratracheal lymph node that is hypermetabolic on PET and  concerning   Past medical history significant for PAD, carotid artery disease, CAD status post CABG, hypertension, history of MI, BPH, gout. He is a patient [name], was referred to [name] for biopsy of the lung nodule of concern.  Synopsis 87 year old gentleman, past medical history of hypertension, gout, BPH, history of MI. He also has history of coronary artery bypass grafting in 2011. Presents with abnormal pet imaging for a right upper lobe pulmonary nodule and paratracheal lymph node that is hypermetabolic concerning for lung cancer. He is a longstanding smoker has smoked since he was a teenager. Now going on 60+ years.    06/28/2022 Pt. Presents for follow up. Pt. Was initially seen by [name] 04/28/2022. Plan was for bronchoscopy on 05/10/2022. Patient presented but he had taken his blood thinner and procedure was cancelled and rescheduled for 05/31/2022. Patient was a no show on that date. He is here today with the intention of getting him referred for biopsy to another medical center, as we have had 2 procedure cancellations.  When I started talking with the patient , he stated that he had no transportation. As a result, we cannot refer him to a more distant center. I spoke with [name]. He is willing to give this a try one more time. Per the patient he has a cousin that is more reliable than his daughter. She may be a better option to help get him to the hospital, and stay during the procedure. Oncology nurse navigation did reach out and patient can get transportation through his insurance, but he will need someone with him from beginning to until after discharge for the procedure day. I will check with [name] on when he feels he can get the patient  scheduled. We will let the patient know once we have scheduling options.   He is on Plavix.  This will need to be held for a 5-day full washout prior to that.  We will have him drop his 325 mg aspirin to an 81 mg aspirin. He can restart his original regimen after the procedure.  Test Results: Nuclear medicine pet imaging 03/23/2022: Patient with a right upper lobe pulmonary nodule and small paratracheal node concerning for malignancy. The patient's images have been independently reviewed by me.   05/16/2019 PFT: FVC 92, FEV1 100, ratio 75, TLC 107, DLCOunc 60.  Positive BD 02/03/2022 super D CT chest: Atherosclerosis.  Status post CABG.  No LAD.  Elongated, irregular nodule of the right upper lobe now measuring 2 x 0.7 x 0.6 cm, previously 1.4 x 0.7 x 0.6 cm June 2023.  Mild centrilobular and paraseptal emphysema.  Diffuse bilateral bronchial wall thickening.  Mild pulmonary fibrosis; not UIP.  Multiple bilateral exophytic renal lesions of varying attenuation, incompletely characterized 03/01/2022 PFT: FVC 98, FEV1 108, ratio 77, DLCO 62    Latest Ref Rng & Units 05/10/2022    7:15 AM 10/26/2019    8:02 AM 10/23/2019    1:00 PM  CBC  WBC 4.0 - 10.5 K/uL 7.3  16.2  8.7   Hemoglobin 13.0 - 17.0 g/dL 16.1  09.6  04.5   Hematocrit 39.0 - 52.0 % 34.7  31.9  41.4   Platelets 150 - 400 K/uL 261  228  222        Latest Ref Rng & Units 05/10/2022    7:15 AM 10/26/2019    8:02 AM 10/23/2019    1:00 PM  BMP  Glucose 70 - 99 mg/dL 94  409  87   BUN 8 - 23 mg/dL 15  20  17    Creatinine 0.61 - 1.24 mg/dL 8.11  9.14  7.82   Sodium 135 - 145 mmol/L 133  133  133   Potassium 3.5 - 5.1 mmol/L 4.3  4.7  5.0   Chloride 98 - 111 mmol/L 101  104  101   CO2 22 - 32 mmol/L 20  20  21    Calcium 8.9 - 10.3 mg/dL 9.2  8.9  9.7     BNP No results found for: "BNP"  ProBNP No results found for: "PROBNP"  PFT    Component Value Date/Time   FEV1PRE 2.54 03/01/2022 0919   FEV1POST 2.71 05/16/2019 0954   FVCPRE  3.31 03/01/2022 0919   FVCPOST 3.62 05/16/2019 0954   TLC 6.93 05/16/2019 0954   DLCOUNC 13.62 03/01/2022 0919   PREFEV1FVCRT 77 03/01/2022 0919   PSTFEV1FVCRT 75 05/16/2019 0954    No results found.   Past medical hx Past Medical History:  Diagnosis Date   BPH (benign prostatic hyperplasia)    Carotid artery disease (HCC)    S/p B endarterectomies, s/p R carotid stent. S/p R TCAR 2021   Coronary artery disease    s/p CABG 2010   Gout, unspecified    history x1   Hypertension    Myocardial infarction [facility]) 2011   Peripheral artery disease (HCC)    s/p L common iliac artery angioplasty with stent placement 2010 for claudication; s/p L SFA angioplasty 2013     Social History   Tobacco Use   Smoking status: Every Day    Packs/day: 0.40    Years: 65.00    Additional pack years: 0.00    Total pack years: 26.00    Types: Cigarettes   Smokeless tobacco: Never   Tobacco comments:    Less than a 0.5ppd         HEI, 03/01/2022  Vaping Use   Vaping Use: Never used  Substance Use Topics   Alcohol use: Not Currently   Drug use: Never    [name] reports that he has been smoking cigarettes. He has a 26.00 pack-year smoking history. He has never used smokeless tobacco. He reports that he does not currently use alcohol. He reports that he does not use drugs.  Tobacco Cessation: Current every day smoker, working on quitting, but still smoking about half a pack per day  Past surgical hx, Family hx, Social hx all reviewed.  Current Outpatient Medications on File Prior to Visit  Medication Sig   albuterol (ACCUNEB) 1.25 MG/3ML nebulizer solution Take 1.25 mg by nebulization every 6 (six) hours as needed for shortness of breath or wheezing.   aspirin 325 MG tablet Take 325 mg by mouth every evening.   clopidogrel (PLAVIX) 75 MG tablet Take 75 mg by mouth daily.   finasteride (PROSCAR) 5 MG tablet Take 5 mg by mouth every evening.   losartan (COZAAR) 100 MG tablet Take 100 mg  by mouth daily.   lovastatin (MEVACOR) 20 MG tablet    lovastatin (MEVACOR) 40 MG tablet Take 1 tablet (40 mg total) by mouth at bedtime.   pantoprazole (PROTONIX) 40 MG tablet Take 40 mg by mouth daily.   tamsulosin (FLOMAX)  0.4 MG CAPS capsule Take 0.4 mg by mouth daily.   nicotine (NICODERM CQ - DOSED IN MG/24 HOURS) 21 mg/24hr patch Place 1 patch (21 mg total) onto the skin daily. (Patient not taking: Reported on 06/24/2022)   No current facility-administered medications on file prior to visit.     Allergies  Allergen Reactions   Drug Class [Morphine And Related] Other (See Comments)    Hallucinations.    Review Of Systems:  Constitutional:   No  weight loss, night sweats,  Fevers, chills, fatigue, or  lassitude.  HEENT:   No headaches,  Difficulty swallowing,  Tooth/dental problems, or  Sore throat,                No sneezing, itching, ear ache, nasal congestion, post nasal drip,   CV:  No chest pain,  Orthopnea, PND, swelling in lower extremities, anasarca, dizziness, palpitations, syncope.   GI  No heartburn, indigestion, abdominal pain, nausea, vomiting, diarrhea, change in bowel habits, loss of appetite, bloody stools.   Resp: No shortness of breath with exertion or at rest.  No excess mucus, no productive cough,  No non-productive cough,  No coughing up of blood.  No change in color of mucus.  No wheezing.  No chest wall deformity  Skin: no rash or lesions.  GU: no dysuria, change in color of urine, no urgency or frequency.  No flank pain, no hematuria   MS:  No joint pain or swelling.  No decreased range of motion.  No back pain.  Psych:  No change in mood or affect. No depression or anxiety.  No memory loss.   Vital Signs BP 112/62 (BP Location: Left Arm, Patient Position: Sitting, Cuff Size: Normal)   Pulse 62   Temp (!) 97.5 F (36.4 C) (Oral)   Ht 5\' 7"  (1.702 m)   Wt 173 lb (78.5 kg)   SpO2 98%   BMI 27.10 kg/m    Physical Exam:  General- No  distress,  A&Ox3, pleasant  ENT: No sinus tenderness, TM clear, pale nasal mucosa, no oral exudate,no post nasal drip, no LAN Cardiac: S1, S2, regular rate and rhythm, no murmur Chest: + wheeze/ No rales/ dullness; no accessory muscle use, no nasal flaring, no sternal retractions, diminished per bases Abd.: Soft Non-tender, ND, BS +, Body mass index is 27.1 kg/m.  Ext: No clubbing cyanosis, edema Neuro:  normal strength, MAE x 4, A&O x 3 Skin: No rashes, warm and dry Psych: normal mood and behavior   Assessment/Plan Right upper lobe pulmonary nodule and paratracheal lymph node that is hypermetabolic concerning for lung cancer on PET. Current every day smoker  Has missed 2 scheduled bronchoscopies, one due to failure to hold his blood thinner, and one as a no show There are transportation issues Plan You need a biopsy of the right upper lobe nodule. I will reach out to Social Work to see if they can help to get transportation arranged.  You will need a family member to stay with you during the procedure, because you will have just had general anesthesia.  We will call you and let you know if we can get help with transportation, and then we will schedule the procedure.  Call us if you have any questions  Work on quitting smoking Please contact office for sooner follow up if symptoms do not improve or worsen or seek emergency care    I spent 35 minutes dedicated to the care of this patient on the  date of this encounter to include pre-visit review of records, face-to-face time with the patient discussing conditions above, post visit ordering of testing, clinical documentation with the electronic health record, making appropriate referrals as documented, and communicating necessary information to the patient's healthcare team.    Bevelyn Ngo, NP 06/28/2022  2:27 PM

## 2022-06-27 NOTE — Progress Notes (Signed)
NN attempted to reach out to pt's daughter to discuss transportation arrangements for the pt once his biopsy has been rescheduled. No answer and [facility] is full. Will try again at a later time.

## 2022-06-28 ENCOUNTER — Encounter: Payer: Self-pay | Admitting: Acute Care

## 2022-06-29 ENCOUNTER — Telehealth: Payer: Self-pay | Admitting: Acute Care

## 2022-06-29 NOTE — Telephone Encounter (Signed)
I spoke with patients daughter she states the patient has transportation through [place] and she will always been staying with him during the procedure. She is wanting to try and have procedure re-scheduled as soon as possible   Maralyn Sago please advise?

## 2022-06-29 NOTE — Telephone Encounter (Signed)
Pt daughter calling in bc her dad was supposed to get scheduled for a biopsy per last AVS summary. Pt daughter says no one has called her to get him scheduled. Pls advise

## 2022-07-04 NOTE — Telephone Encounter (Signed)
I called pt again and was able to leave a vm.  I called dtr and vm is still full.

## 2022-07-04 NOTE — Telephone Encounter (Signed)
Spoke to daughter told her I would have the nurse call her back

## 2022-07-04 NOTE — Telephone Encounter (Signed)
I have scheduled pt for 6/4 at 3:30.  Info listed in original referral.  I tried to call pt and there was answer and then silence.  Tried to call dtr and vm is full.

## 2022-07-04 NOTE — Telephone Encounter (Signed)
ATC X1. Unable to reach patient. Will attempt to call back later

## 2022-07-05 ENCOUNTER — Encounter: Payer: Self-pay | Admitting: Pulmonary Disease

## 2022-07-05 NOTE — Telephone Encounter (Signed)
I spoke to dtr and gave her appt info.  She states [employer] is providing transportation to and from procedure.  Explained to her per SG someone will have to be with him on the [place] - can't just be put on van and sent home.  She states ok - she will have to call cousin.  Will be one of them.  Appt info in referral.  Will close message.

## 2022-07-15 ENCOUNTER — Other Ambulatory Visit: Payer: Medicare HMO

## 2022-07-15 ENCOUNTER — Other Ambulatory Visit: Payer: Self-pay

## 2022-07-15 DIAGNOSIS — R911 Solitary pulmonary nodule: Secondary | ICD-10-CM

## 2022-07-16 LAB — NOVEL CORONAVIRUS, NAA: SARS-CoV-2, NAA: NOT DETECTED

## 2022-07-18 NOTE — Progress Notes (Addendum)
Unable to reach patient or daughter (via phone or text message) to give instructions for procedure tomorrow.   Daughter called back. Instructions given

## 2022-07-19 ENCOUNTER — Ambulatory Visit (HOSPITAL_BASED_OUTPATIENT_CLINIC_OR_DEPARTMENT_OTHER): Payer: Medicare HMO | Admitting: Physician Assistant

## 2022-07-19 ENCOUNTER — Ambulatory Visit (HOSPITAL_COMMUNITY): Payer: Medicare HMO | Admitting: Physician Assistant

## 2022-07-19 ENCOUNTER — Ambulatory Visit (HOSPITAL_COMMUNITY)
Admission: RE | Admit: 2022-07-19 | Discharge: 2022-07-19 | Disposition: A | Discharge status: Home / Self Care | Payer: Medicare HMO | Source: Ambulatory Visit | Attending: Pulmonary Disease | Admitting: Pulmonary Disease

## 2022-07-19 ENCOUNTER — Ambulatory Visit (HOSPITAL_COMMUNITY): Payer: Medicare HMO

## 2022-07-19 ENCOUNTER — Encounter (HOSPITAL_COMMUNITY): Payer: Self-pay | Admitting: Pulmonary Disease

## 2022-07-19 ENCOUNTER — Encounter (HOSPITAL_COMMUNITY)
Admission: RE | Disposition: A | Discharge status: Home / Self Care | Payer: Self-pay | Source: Ambulatory Visit | Attending: Pulmonary Disease

## 2022-07-19 ENCOUNTER — Other Ambulatory Visit: Payer: Self-pay

## 2022-07-19 DIAGNOSIS — I2510 Atherosclerotic heart disease of native coronary artery without angina pectoris: Secondary | ICD-10-CM

## 2022-07-19 DIAGNOSIS — C3411 Malignant neoplasm of upper lobe, right bronchus or lung: Secondary | ICD-10-CM | POA: Insufficient documentation

## 2022-07-19 DIAGNOSIS — I739 Peripheral vascular disease, unspecified: Secondary | ICD-10-CM | POA: Diagnosis not present

## 2022-07-19 DIAGNOSIS — R911 Solitary pulmonary nodule: Secondary | ICD-10-CM | POA: Diagnosis not present

## 2022-07-19 DIAGNOSIS — R590 Localized enlarged lymph nodes: Secondary | ICD-10-CM | POA: Diagnosis not present

## 2022-07-19 DIAGNOSIS — R599 Enlarged lymph nodes, unspecified: Secondary | ICD-10-CM | POA: Diagnosis not present

## 2022-07-19 DIAGNOSIS — Z0389 Encounter for observation for other suspected diseases and conditions ruled out: Secondary | ICD-10-CM | POA: Diagnosis not present

## 2022-07-19 DIAGNOSIS — R6889 Other general symptoms and signs: Secondary | ICD-10-CM | POA: Diagnosis not present

## 2022-07-19 DIAGNOSIS — J449 Chronic obstructive pulmonary disease, unspecified: Secondary | ICD-10-CM | POA: Diagnosis not present

## 2022-07-19 DIAGNOSIS — I10 Essential (primary) hypertension: Secondary | ICD-10-CM | POA: Diagnosis not present

## 2022-07-19 DIAGNOSIS — F17200 Nicotine dependence, unspecified, uncomplicated: Secondary | ICD-10-CM

## 2022-07-19 DIAGNOSIS — F17210 Nicotine dependence, cigarettes, uncomplicated: Secondary | ICD-10-CM | POA: Insufficient documentation

## 2022-07-19 DIAGNOSIS — I252 Old myocardial infarction: Secondary | ICD-10-CM | POA: Diagnosis not present

## 2022-07-19 DIAGNOSIS — Z951 Presence of aortocoronary bypass graft: Secondary | ICD-10-CM | POA: Insufficient documentation

## 2022-07-19 HISTORY — PX: VIDEO BRONCHOSCOPY WITH ENDOBRONCHIAL ULTRASOUND: SHX6177

## 2022-07-19 HISTORY — PX: BRONCHIAL BRUSHINGS: SHX5108

## 2022-07-19 HISTORY — PX: BRONCHIAL NEEDLE ASPIRATION BIOPSY: SHX5106

## 2022-07-19 HISTORY — PX: BRONCHIAL BIOPSY: SHX5109

## 2022-07-19 HISTORY — PX: FINE NEEDLE ASPIRATION: SHX5430

## 2022-07-19 LAB — BASIC METABOLIC PANEL
Anion gap: 9 (ref 5–15)
BUN: 20 mg/dL (ref 8–23)
CO2: 19 mmol/L — ABNORMAL LOW (ref 22–32)
Calcium: 9.3 mg/dL (ref 8.9–10.3)
Chloride: 103 mmol/L (ref 98–111)
Creatinine, Ser: 1.08 mg/dL (ref 0.61–1.24)
GFR, Estimated: >60 mL/min (ref >60)
Glucose, Bld: 95 mg/dL (ref 70–99)
Potassium: 4.5 mmol/L (ref 3.5–5.1)
Sodium: 131 mmol/L — ABNORMAL LOW (ref 135–145)

## 2022-07-19 LAB — CBC
HCT: 33.6 % — ABNORMAL LOW (ref 39.0–52.0)
Hemoglobin: 10.7 g/dL — ABNORMAL LOW (ref 13.0–17.0)
MCH: 28 pg (ref 26.0–34.0)
MCHC: 31.8 g/dL (ref 30.0–36.0)
MCV: 88 fL (ref 80.0–100.0)
Platelets: 250 10*3/uL (ref 150–400)
RBC: 3.82 MIL/uL — ABNORMAL LOW (ref 4.22–5.81)
RDW: 12.9 % (ref 11.5–15.5)
WBC: 8.5 10*3/uL (ref 4.0–10.5)
nRBC: 0 % (ref 0.0–0.2)

## 2022-07-19 SURGERY — BRONCHOSCOPY, WITH BIOPSY USING ELECTROMAGNETIC NAVIGATION
Anesthesia: General | Laterality: Right

## 2022-07-19 MED ORDER — LIDOCAINE 2% (20 MG/ML) 5 ML SYRINGE
INTRAMUSCULAR | Status: DC | PRN
  Administered 2022-07-19: 60 mg via INTRAVENOUS

## 2022-07-19 MED ORDER — FENTANYL CITRATE (PF) 100 MCG/2ML IJ SOLN
INTRAMUSCULAR | Status: DC | PRN
  Administered 2022-07-19 (×2): 50 ug via INTRAVENOUS

## 2022-07-19 MED ORDER — ROCURONIUM BROMIDE 10 MG/ML (PF) SYRINGE
PREFILLED_SYRINGE | INTRAVENOUS | Status: DC | PRN
  Administered 2022-07-19: 30 mg via INTRAVENOUS

## 2022-07-19 MED ORDER — SUCCINYLCHOLINE CHLORIDE 200 MG/10ML IV SOSY
PREFILLED_SYRINGE | INTRAVENOUS | Status: DC | PRN
  Administered 2022-07-19: 100 mg via INTRAVENOUS

## 2022-07-19 MED ORDER — LACTATED RINGERS IV SOLN: INTRAVENOUS | Status: DC

## 2022-07-19 MED ORDER — PHENYLEPHRINE 80 MCG/ML (10ML) SYRINGE FOR IV PUSH (FOR BLOOD PRESSURE SUPPORT)
PREFILLED_SYRINGE | INTRAVENOUS | Status: DC | PRN
  Administered 2022-07-19: 80 ug via INTRAVENOUS

## 2022-07-19 MED ORDER — SUGAMMADEX SODIUM 200 MG/2ML IV SOLN
INTRAVENOUS | Status: DC | PRN
  Administered 2022-07-19: 200 mg via INTRAVENOUS

## 2022-07-19 MED ORDER — DEXAMETHASONE SODIUM PHOSPHATE 10 MG/ML IJ SOLN
INTRAMUSCULAR | Status: DC | PRN
  Administered 2022-07-19: 10 mg via INTRAVENOUS

## 2022-07-19 MED ORDER — ONDANSETRON HCL 4 MG/2ML IJ SOLN
INTRAMUSCULAR | Status: DC | PRN
  Administered 2022-07-19: 4 mg via INTRAVENOUS

## 2022-07-19 MED ORDER — PROPOFOL 10 MG/ML IV BOLUS
INTRAVENOUS | Status: DC | PRN
  Administered 2022-07-19: 40 mg via INTRAVENOUS
  Administered 2022-07-19: 110 mg via INTRAVENOUS

## 2022-07-19 MED ORDER — CHLORHEXIDINE GLUCONATE 0.12 % MT SOLN
OROMUCOSAL | Status: AC
  Administered 2022-07-19: 15 mL via OROMUCOSAL
  Filled 2022-07-19: qty 15

## 2022-07-19 MED ORDER — CHLORHEXIDINE GLUCONATE 0.12 % MT SOLN: 15.0000 mL | Freq: Once | OROMUCOSAL | Status: AC

## 2022-07-19 MED ORDER — PROPOFOL 500 MG/50ML IV EMUL
INTRAVENOUS | Status: DC | PRN
  Administered 2022-07-19: 100 ug/kg/min via INTRAVENOUS

## 2022-07-19 SURGICAL SUPPLY — 29 items
BRUSH CYTOL CELLEBRITY 1.5X140 (MISCELLANEOUS) IMPLANT
CANISTER SUCT 3000ML PPV (MISCELLANEOUS) ×3 IMPLANT
CONT SPEC 4OZ CLIKSEAL STRL BL (MISCELLANEOUS) ×3 IMPLANT
COVER BACK TABLE 60X90IN (DRAPES) ×3 IMPLANT
COVER DOME SNAP 22 D (MISCELLANEOUS) ×3 IMPLANT
FORCEPS BIOP RJ4 1.8 (CUTTING FORCEPS) IMPLANT
GAUZE SPONGE 4X4 12PLY STRL (GAUZE/BANDAGES/DRESSINGS) ×3 IMPLANT
GLOVE BIO SURGEON STRL SZ7.5 (GLOVE) ×3 IMPLANT
GOWN STRL REUS W/ TWL LRG LVL3 (GOWN DISPOSABLE) ×3 IMPLANT
GOWN STRL REUS W/TWL LRG LVL3 (GOWN DISPOSABLE) ×3
KIT CLEAN ENDO COMPLIANCE (KITS) ×6 IMPLANT
KIT TURNOVER KIT B (KITS) ×3 IMPLANT
MARKER SKIN DUAL TIP RULER LAB (MISCELLANEOUS) ×3 IMPLANT
NDL EBUS SONO TIP PENTAX (NEEDLE) ×3 IMPLANT
NEEDLE EBUS SONO TIP PENTAX (NEEDLE) ×3 IMPLANT
NS IRRIG 1000ML POUR BTL (IV SOLUTION) ×3 IMPLANT
OIL SILICONE PENTAX (PARTS (SERVICE/REPAIRS)) ×3 IMPLANT
PAD ARMBOARD 7.5X6 YLW CONV (MISCELLANEOUS) ×6 IMPLANT
SOL ANTI FOG 6CC (MISCELLANEOUS) ×3 IMPLANT
SYR 20CC LL (SYRINGE) ×6 IMPLANT
SYR 20ML ECCENTRIC (SYRINGE) ×6 IMPLANT
SYR 50ML SLIP (SYRINGE) IMPLANT
SYR 5ML LUER SLIP (SYRINGE) ×3 IMPLANT
TOWEL OR 17X24 6PK STRL BLUE (TOWEL DISPOSABLE) ×3 IMPLANT
TRAP SPECIMEN MUCOUS 40CC (MISCELLANEOUS) IMPLANT
TUBE CONNECTING 20X1/4 (TUBING) ×6 IMPLANT
UNDERPAD 30X30 (UNDERPADS AND DIAPERS) ×3 IMPLANT
VALVE DISPOSABLE (MISCELLANEOUS) ×3 IMPLANT
WATER STERILE IRR 1000ML POUR (IV SOLUTION) ×3 IMPLANT

## 2022-07-19 NOTE — Op Note (Signed)
Video Bronchoscopy with Robotic Assisted Bronchoscopic Navigation  Video Bronchoscopy with Endobronchial Ultrasound Procedure Note  Date of Operation: 07/19/2022   Pre-op Diagnosis: RUL lung nodule   Post-op Diagnosis: RUL lung nodule   Surgeon: Josephine Igo, DO   Assistants: None   Anesthesia: General endotracheal anesthesia  Operation: Flexible video fiberoptic bronchoscopy with robotic assistance and biopsies.  Estimated Blood Loss: Minimal  Complications: None  Indications and History: Terry Fitzgerald is a 87 y.o. male with history of RUL lung nodule. The risks, benefits, complications, treatment options and expected outcomes were discussed with the patient.  The possibilities of pneumothorax, pneumonia, reaction to medication, pulmonary aspiration, perforation of a viscus, bleeding, failure to diagnose a condition and creating a complication requiring transfusion or operation were discussed with the patient who freely signed the consent.    Description of Procedure: The patient was seen in the Preoperative Area, was examined and was deemed appropriate to proceed.  The patient was taken to [facility] endoscopy room 3, identified as Terry Fitzgerald and the procedure verified as Flexible Video Fiberoptic Bronchoscopy.  A Time Out was held and the above information confirmed.   Prior to the date of the procedure a high-resolution CT scan of the chest was performed. Utilizing ION software program a virtual tracheobronchial tree was generated to allow the creation of distinct navigation pathways to the patient's parenchymal abnormalities. After being taken to the operating room general anesthesia was initiated and the patient  was orally intubated. The video fiberoptic bronchoscope was introduced via the endotracheal tube and a general inspection was performed which showed normal right and left lung anatomy, aspiration of the bilateral mainstems was completed to remove any remaining secretions.  Robotic catheter inserted into patient's endotracheal tube.   Target #1 RUL lung nodule: The distinct navigation pathways prepared prior to this procedure were then utilized to navigate to patient's lesion identified on CT scan. The robotic catheter was secured into place and the vision probe was withdrawn.  Lesion location was approximated using fluoroscopy and 3D CBCT for CT guided needle placement or peripheral targeting. Under fluoroscopic guidance transbronchial needle brushings, transbronchial needle biopsies, and transbronchial forceps biopsies were performed to be sent for cytology and pathology.  Target #2 Station 4R: The standard scope was then withdrawn and the endobronchial ultrasound was used to identify and characterize the peritracheal, hilar and bronchial lymph nodes. Inspection showed no evidence of endobronchial lesion. Using real-time ultrasound guidance Wang needle biopsies were take from Station 4R nodes and were sent for cytology. The patient tolerated the procedure well without apparent complications. There was no significant blood loss. The bronchoscope was withdrawn.  At the end of the procedure a general airway inspection was performed and there was no evidence of active bleeding. The bronchoscope was removed.  The patient tolerated the procedure well. There was no significant blood loss and there were no obvious complications. A post-procedural chest x-ray is pending.  Samples Target #1: 1. Transbronchial needle brushings from RUL 2. Transbronchial Wang needle biopsies from RUL 3. Transbronchial forceps biopsies from RUL  Samples Target #2: 1. Wang needle biopsies from Station 4R node  Plans:  The patient will be discharged from the PACU to home when recovered from anesthesia and after chest x-ray is reviewed. We will review the cytology, pathology results with the patient when they become available. Outpatient followup will be with Josephine Igo, DO.  Josephine Igo, DO [facility] 07/19/2022 3:16 PM

## 2022-07-19 NOTE — Discharge Instructions (Signed)

## 2022-07-19 NOTE — Interval H&P Note (Signed)
History and Physical Interval Note:  07/19/2022 1:53 PM  Terry Fitzgerald  has presented today for surgery, with the diagnosis of lung nodule, adenopathy.  The various methods of treatment have been discussed with the patient and family. After consideration of risks, benefits and other options for treatment, the patient has consented to  Procedure(s) with comments: ROBOTIC ASSISTED NAVIGATIONAL BRONCHOSCOPY (Right) - ION w/ CIOS VIDEO BRONCHOSCOPY WITH ENDOBRONCHIAL ULTRASOUND (Bilateral) as a surgical intervention.  The patient's history has been reviewed, patient examined, no change in status, stable for surgery.  I have reviewed the patient's chart and labs.  Questions were answered to the patient's satisfaction.     Rachel Bo Darilyn Storbeck

## 2022-07-19 NOTE — Transfer of Care (Signed)
Immediate Anesthesia Transfer of Care Note  Patient: Terry Fitzgerald  Procedure(s) Performed: ROBOTIC ASSISTED NAVIGATIONAL BRONCHOSCOPY (Right) VIDEO BRONCHOSCOPY WITH ENDOBRONCHIAL ULTRASOUND (Bilateral) BRONCHIAL BRUSHINGS BRONCHIAL NEEDLE ASPIRATION BIOPSIES BRONCHIAL BIOPSIES FINE NEEDLE ASPIRATION (FNA) LINEAR  Patient Location: Endoscopy Unit  Anesthesia Type:General  Level of Consciousness: awake, alert , and oriented  Airway & Oxygen Therapy: Patient Spontanous Breathing and Patient connected to face mask oxygen  Post-op Assessment: Report given to RN and Post -op Vital signs reviewed and stable  Post vital signs: Reviewed and stable  Last Vitals:  Vitals Value Taken Time  BP 138/58 07/19/22 1520  Temp 36.2 C 07/19/22 1516  Pulse 63 07/19/22 1522  Resp 15 07/19/22 1522  SpO2 100 % 07/19/22 1522  Vitals shown include unvalidated device data.  Last Pain:  Vitals:   07/19/22 1516  TempSrc: Temporal  PainSc:          Complications: No notable events documented.

## 2022-07-19 NOTE — Anesthesia Postprocedure Evaluation (Signed)
Anesthesia Post Note  Patient: Terry Fitzgerald  Procedure(s) Performed: ROBOTIC ASSISTED NAVIGATIONAL BRONCHOSCOPY (Right) VIDEO BRONCHOSCOPY WITH ENDOBRONCHIAL ULTRASOUND (Bilateral) BRONCHIAL BRUSHINGS BRONCHIAL NEEDLE ASPIRATION BIOPSIES BRONCHIAL BIOPSIES FINE NEEDLE ASPIRATION (FNA) LINEAR     Patient location during evaluation: PACU Anesthesia Type: General Level of consciousness: awake and alert Pain management: pain level controlled Vital Signs Assessment: post-procedure vital signs reviewed and stable Respiratory status: spontaneous breathing, nonlabored ventilation and respiratory function stable Cardiovascular status: stable and blood pressure returned to baseline Anesthetic complications: no   No notable events documented.  Last Vitals:  Vitals:   07/19/22 1600 07/19/22 1610  BP: (!) 135/56 (!) 140/64  Pulse: (!) 55 (!) 55  Resp: 12 11  Temp:    SpO2: 100% 98%    Last Pain:  Vitals:   07/19/22 1610  TempSrc:   PainSc: 0-No pain                 Beryle Lathe

## 2022-07-19 NOTE — Anesthesia Preprocedure Evaluation (Signed)
Anesthesia Evaluation  Patient identified by MRN, date of birth, ID band Patient awake    Reviewed: Allergy & Precautions, NPO status , Patient's Chart, lab work & pertinent test results  History of Anesthesia Complications Negative for: history of anesthetic complications  Airway Mallampati: II  TM Distance: >3 FB Neck ROM: Full    Dental  (+) Dental Advisory Given   Pulmonary COPD,  COPD inhaler, Current Smoker and Patient abstained from smoking. Lung nodule   breath sounds clear to auscultation       Cardiovascular hypertension, Pt. on medications (-) angina + CAD, + Past MI, + CABG and + Peripheral Vascular Disease   Rhythm:Regular Rate:Normal  Pt denies any chest pain, heaviness, tightness at rest or exertion   Neuro/Psych negative neurological ROS     GI/Hepatic Neg liver ROS,GERD  Medicated and Controlled,,  Endo/Other  negative endocrine ROS    Renal/GU negative Renal ROS     Musculoskeletal   Abdominal   Peds  Hematology plavix   Anesthesia Other Findings   Reproductive/Obstetrics                             Anesthesia Physical Anesthesia Plan  ASA: 3  Anesthesia Plan: General   Post-op Pain Management: Minimal or no pain anticipated   Induction: Intravenous  PONV Risk Score and Plan: 1 and Ondansetron and Treatment may vary due to age or medical condition  Airway Management Planned: Oral ETT  Additional Equipment: None  Intra-op Plan:   Post-operative Plan: Extubation in OR  Informed Consent: I have reviewed the patients History and Physical, chart, labs and discussed the procedure including the risks, benefits and alternatives for the proposed anesthesia with the patient or authorized representative who has indicated his/her understanding and acceptance.       Plan Discussed with: CRNA and Surgeon  Anesthesia Plan Comments: (See APP note by Joslyn Hy, FNP    )       Anesthesia Quick Evaluation

## 2022-07-19 NOTE — Anesthesia Procedure Notes (Signed)
Procedure Name: Intubation Date/Time: 07/19/2022 2:17 PM  Performed by: Waynard Edwards, CRNAPre-anesthesia Checklist: Patient identified, Emergency Drugs available, Suction available and Patient being monitored Patient Re-evaluated:Patient Re-evaluated prior to induction Oxygen Delivery Method: Circle system utilized Preoxygenation: Pre-oxygenation with 100% oxygen Induction Type: IV induction, Rapid sequence and Cricoid Pressure applied Laryngoscope Size: Miller and 2 Grade View: Grade I Tube type: Oral Tube size: 8.5 mm Number of attempts: 1 Airway Equipment and Method: Stylet Placement Confirmation: ETT inserted through vocal cords under direct vision, positive ETCO2 and breath sounds checked- equal and bilateral Secured at: 22 cm Tube secured with: Tape Dental Injury: Teeth and Oropharynx as per pre-operative assessment

## 2022-07-21 LAB — CYTOLOGY - NON PAP

## 2022-07-21 NOTE — Progress Notes (Signed)
Pathology consistent with squamous cell carcinoma.  Lymph node negative.  Will need referral to radiation oncology.  Seeing SG, NP in clinic next week to discuss results in person and place referrals.  CC: manning FYI   Thanks,  BLI  Terry Igo, DO [facility] 07/21/2022 3:11 PM

## 2022-07-24 ENCOUNTER — Encounter (HOSPITAL_COMMUNITY): Payer: Self-pay | Admitting: Pulmonary Disease

## 2022-07-25 NOTE — Progress Notes (Incomplete)
Thoracic Location of Tumor / Histology: Lung Nodule Right Upper Lobe  05/10/2022 Audie Box CT Super D Chest without Contrast CLINICAL DATA:  Pre procedure. Hypermetabolic right upper lobe pulmonary nodule. * Tracking Code: BO *  IMPRESSION: 1. Imaging for preoperative planning and guidance. 2. Stable spiculated right upper lobe pulmonary nodule which was mildly hypermetabolic on PET-CT, suspicious for malignancy. 3. No evidence of metastatic disease. 4. Stable underlying chronic lung disease. 5.  Aortic Atherosclerosis (ICD10-I70.0).     03/23/2022 Dr. Charlett Lango NM PET Image Restag (PS) Skull Base to Thigh CLINICAL DATA:  Initial treatment strategy for right upper lobe pulmonary nodule.  IMPRESSION: Hypermetabolic right upper lobe pulmonary nodule, suspicious for primary bronchogenic neoplasm.  11 mm short axis right paratracheal node is mildly hypermetabolic but chronic, favored to be reactive. Otherwise, no findings suspicious for metastatic disease.   Tobacco/Marijuana/Snuff/ETOH use: Smoke cigarettes, no smokeless tobacco,drugs, or alcohol.  Past/Anticipated interventions by cardiothoracic surgery, if any:   07/19/2022 Dr. Elige Radon Icard Robotic Assisted Navigational Bronchoscopy  Past/Anticipated interventions by medical oncology, if any: {:18581}  Signs/Symptoms Weight changes, if any: {:18581} Respiratory complaints, if any: {:18581} Hemoptysis, if any: {:18581} Pain issues, if any:  {:18581}  SAFETY ISSUES: Prior radiation? {:18581} Pacemaker/ICD? {:18581}  Possible current pregnancy? Male Is the patient on methotrexate?  No  Current Complaints / other details:

## 2022-07-26 ENCOUNTER — Encounter: Payer: Self-pay | Admitting: Acute Care

## 2022-07-26 ENCOUNTER — Encounter: Payer: Self-pay | Admitting: Radiation Oncology

## 2022-07-26 ENCOUNTER — Other Ambulatory Visit: Payer: Self-pay

## 2022-07-26 ENCOUNTER — Ambulatory Visit
Admission: RE | Admit: 2022-07-26 | Discharge: 2022-07-26 | Disposition: A | Discharge status: Home / Self Care | Payer: Medicare HMO | Source: Ambulatory Visit | Attending: Radiation Oncology | Admitting: Radiation Oncology

## 2022-07-26 ENCOUNTER — Telehealth: Payer: Self-pay | Admitting: Acute Care

## 2022-07-26 ENCOUNTER — Ambulatory Visit (INDEPENDENT_AMBULATORY_CARE_PROVIDER_SITE_OTHER): Payer: Medicare HMO | Admitting: Acute Care

## 2022-07-26 VITALS — Ht 67.0 in | Wt 178.0 lb

## 2022-07-26 VITALS — BP 128/60 | HR 63 | Temp 97.9°F | Ht 67.0 in | Wt 178.6 lb

## 2022-07-26 DIAGNOSIS — C3411 Malignant neoplasm of upper lobe, right bronchus or lung: Secondary | ICD-10-CM

## 2022-07-26 DIAGNOSIS — F17210 Nicotine dependence, cigarettes, uncomplicated: Secondary | ICD-10-CM

## 2022-07-26 DIAGNOSIS — C342 Malignant neoplasm of middle lobe, bronchus or lung: Secondary | ICD-10-CM

## 2022-07-26 DIAGNOSIS — R6889 Other general symptoms and signs: Secondary | ICD-10-CM | POA: Diagnosis not present

## 2022-07-26 NOTE — Telephone Encounter (Signed)
Patient's daughter would like to speak with the doctor or nurse regarding the patient's recent appt.  Please call to discuss further.  CB# [phone]

## 2022-07-26 NOTE — Progress Notes (Signed)
History of Present Illness Terry Fitzgerald is a 87 y.o. male active smoker ( 60 + pack year smoking history) followed for solitary right upper lobe pulmonary nodule and paratracheal lymph node that is hypermetabolic on PET and  concerning   Past medical history significant for PAD, carotid artery disease, CAD status post CABG, hypertension, history of MI, BPH, gout. He is a patient [name], was referred to [name] for biopsy of the lung nodule of concern.  Synopsis 87 year old gentleman, past medical history of hypertension, gout, BPH, history of MI. He also has history of coronary artery bypass grafting in 2011. Presents with abnormal pet imaging for a right upper lobe pulmonary nodule and paratracheal lymph node that is hypermetabolic concerning for lung cancer. He is a longstanding smoker has smoked since he was a teenager. Now going on 60+ years.  Pt. Was initially seen by [name] 04/28/2022. Plan was for bronchoscopy on 05/10/2022. Patient presented but he had taken his blood thinner and procedure was cancelled and rescheduled for 05/31/2022. Patient was a no show on that date. He is here today with the intention of getting him referred for biopsy to another medical center, as we have had 2 procedure cancellations. The issue has been transportation .  He was able to undergo Flexible video fiberoptic bronchoscopy with robotic assistance and biopsies on June 4 th.    07/26/2022 Pt. Presents for follow up after Flexible video fiberoptic bronchoscopy with robotic assistance and biopsies on June 4 th. He states he had no bleeding after the procedure. He denies a sore throat.  We have discussed the patient's biopsy results . He understands that he has lung cancer. He has appointments set up for today at the [facility] with Radiation oncology, but he was unaware of these appointments. He is very forgetful and gets confused easily. We will see if we can arrange for transportation to these  appointments now, so he can make them today. If not we will have to get them rescheduled.  Plan will be for radiation oncology. He is still smoking.He is working on quitting. I have offered him patches or gum or mints. He states he does not need them.    Test Results: FINAL MICROSCOPIC DIAGNOSIS:  A. LUNG, RUL, FINE NEEDLE ASPIRATION  BIOPSY:  - Positive for malignancy morphologically consistent with squamous cell  carcinoma.  B. LUNG, RUL, BRUSHING:  - No malignant cells identified  - Histiocytes and scattered bronchial cells present.   FINAL MICROSCOPIC DIAGNOSIS:  C. LYMPH NODE, 4R, FINE NEEDLE ASPIRATION:  - No malignant cells identified  - Lymphocytes present.   SPECIMEN ADEQUACY:  C. Satisfactory for Evaluation   Nuclear medicine pet imaging 03/23/2022: Patient with a right upper lobe pulmonary nodule and small paratracheal node concerning for malignancy.   05/16/2019 PFT: FVC 92, FEV1 100, ratio 75, TLC 107, DLCOunc 60.  Positive BD  02/03/2022 super D CT chest: Atherosclerosis.  Status post CABG.  No LAD.  Elongated, irregular nodule of the right upper lobe now measuring 2 x 0.7 x 0.6 cm, previously 1.4 x 0.7 x 0.6 cm June 2023.  Mild centrilobular and paraseptal emphysema.  Diffuse bilateral bronchial wall thickening.  Mild pulmonary fibrosis; not UIP.  Multiple bilateral exophytic renal lesions of varying attenuation, incompletely characterized 03/01/2022 PFT: FVC 98, FEV1 108, ratio 77, DLCO 62    Latest Ref Rng & Units 07/19/2022    1:26 PM 05/10/2022    7:15 AM 10/26/2019  8:02 AM  CBC  WBC 4.0 - 10.5 K/uL 8.5  7.3  16.2   Hemoglobin 13.0 - 17.0 g/dL 57.8  46.9  62.9   Hematocrit 39.0 - 52.0 % 33.6  34.7  31.9   Platelets 150 - 400 K/uL 250  261  228        Latest Ref Rng & Units 07/19/2022    1:26 PM 05/10/2022    7:15 AM 10/26/2019    8:02 AM  BMP  Glucose 70 - 99 mg/dL 95  94  528   BUN 8 - 23 mg/dL 20  15  20    Creatinine 0.61 - 1.24 mg/dL 4.13  2.44  0.10    Sodium 135 - 145 mmol/L 131  133  133   Potassium 3.5 - 5.1 mmol/L 4.5  4.3  4.7   Chloride 98 - 111 mmol/L 103  101  104   CO2 22 - 32 mmol/L 19  20  20    Calcium 8.9 - 10.3 mg/dL 9.3  9.2  8.9     BNP No results found for: "BNP"  ProBNP No results found for: "PROBNP"  PFT    Component Value Date/Time   FEV1PRE 2.54 03/01/2022 0919   FEV1POST 2.71 05/16/2019 0954   FVCPRE 3.31 03/01/2022 0919   FVCPOST 3.62 05/16/2019 0954   TLC 6.93 05/16/2019 0954   DLCOUNC 13.62 03/01/2022 0919   PREFEV1FVCRT 77 03/01/2022 0919   PSTFEV1FVCRT 75 05/16/2019 0954    DG Chest Port 1 View  Result Date: 07/19/2022 CLINICAL DATA:  Status post bronchoscopy. EXAM: PORTABLE CHEST 1 VIEW COMPARISON:  Chest CT 05/10/2022 FINDINGS: The lungs are clear without focal pneumonia, edema, pneumothorax or pleural effusion. Ill-defined nodular densities at the lung bases compatible with the peripheral chronic interstitial disease seen on recent chest CT. Cardiopericardial silhouette is at upper limits of normal for size. No acute bony abnormality. Telemetry leads overlie the chest. IMPRESSION: No acute cardiopulmonary findings. Electronically Signed   By: Kennith Center M.D.   On: 07/19/2022 16:36   DG C-ARM BRONCHOSCOPY  Result Date: 07/19/2022 C-ARM BRONCHOSCOPY: Fluoroscopy was utilized by the requesting physician.  No radiographic interpretation.     Past medical hx Past Medical History:  Diagnosis Date   BPH (benign prostatic hyperplasia)    Carotid artery disease (HCC)    S/p B endarterectomies, s/p R carotid stent. S/p R TCAR 2021   Coronary artery disease    s/p CABG 2010   Gout, unspecified    history x1   Hypertension    Myocardial infarction [facility]) 2011   Peripheral artery disease (HCC)    s/p L common iliac artery angioplasty with stent placement 2010 for claudication; s/p L SFA angioplasty 2013     Social History   Tobacco Use   Smoking status: Every Day    Packs/day: 1.00    Years:  65.00    Additional pack years: 0.00    Total pack years: 65.00    Types: Cigarettes   Smokeless tobacco: Never   Tobacco comments:    Less than a 0.5ppd         HEI, 03/01/2022  Vaping Use   Vaping Use: Never used  Substance Use Topics   Alcohol use: Not Currently   Drug use: Never    Mr.Granholm reports that he has been smoking cigarettes. He has a 65.00 pack-year smoking history. He has never used smokeless tobacco. He reports that he does not currently use alcohol. He reports that  he does not use drugs.  Tobacco Cessation: Current every day smoker, working on quitting, but still smoking about half a pack per day . 65 pack year smoking history  Past surgical hx, Family hx, Social hx all reviewed.  Current Outpatient Medications on File Prior to Visit  Medication Sig   albuterol (ACCUNEB) 1.25 MG/3ML nebulizer solution Take 1.25 mg by nebulization every 6 (six) hours as needed for shortness of breath or wheezing.   aspirin 325 MG tablet Take 325 mg by mouth every evening.   clopidogrel (PLAVIX) 75 MG tablet Take 75 mg by mouth daily.   finasteride (PROSCAR) 5 MG tablet Take 5 mg by mouth every evening.   losartan (COZAAR) 100 MG tablet Take 100 mg by mouth daily.   lovastatin (MEVACOR) 20 MG tablet Take 20 mg by mouth in the morning.   pantoprazole (PROTONIX) 40 MG tablet Take 40 mg by mouth daily.   tamsulosin (FLOMAX) 0.4 MG CAPS capsule Take 0.4 mg by mouth daily.   carbamazepine (CARBATROL) 100 MG 12 hr capsule Take 100 mg by mouth daily. (Patient not taking: Reported on 07/26/2022)   lovastatin (MEVACOR) 40 MG tablet Take 1 tablet (40 mg total) by mouth at bedtime. (Patient not taking: Reported on 07/19/2022)   nicotine (NICODERM CQ - DOSED IN MG/24 HOURS) 21 mg/24hr patch Place 1 patch (21 mg total) onto the skin daily. (Patient not taking: Reported on 06/24/2022)   No current facility-administered medications on file prior to visit.     Allergies  Allergen Reactions    Drug Class [Morphine And Codeine] Other (See Comments)    Hallucinations.    Review Of Systems:  Constitutional:   No  weight loss, night sweats,  Fevers, chills, fatigue, or  lassitude.  HEENT:   No headaches,  Difficulty swallowing,  Tooth/dental problems, or  Sore throat,                No sneezing, itching, ear ache, nasal congestion, post nasal drip,   CV:  No chest pain,  Orthopnea, PND, swelling in lower extremities, anasarca, dizziness, palpitations, syncope.   GI  No heartburn, indigestion, abdominal pain, nausea, vomiting, diarrhea, change in bowel habits, loss of appetite, bloody stools.   Resp: No shortness of breath with exertion or at rest.  No excess mucus, no productive cough,  No non-productive cough,  No coughing up of blood.  No change in color of mucus.  No wheezing.  No chest wall deformity  Skin: no rash or lesions.  GU: no dysuria, change in color of urine, no urgency or frequency.  No flank pain, no hematuria   MS:  No joint pain or swelling.  No decreased range of motion.  No back pain.  Psych:  No change in mood or affect. No depression or anxiety.  No memory loss.   Vital Signs BP 128/60 (BP Location: Right Arm, Cuff Size: Normal)   Pulse 63   Temp 97.9 F (36.6 C) (Temporal)   Ht 5\' 7"  (1.702 m)   Wt 178 lb 9.6 oz (81 kg)   SpO2 97%   BMI 27.97 kg/m    Physical Exam:  General- No distress,  A&Ox3, pleasant but forgetful ENT: No sinus tenderness, TM clear, pale nasal mucosa, no oral exudate,no post nasal drip, no LAN Cardiac: S1, S2, regular rate and rhythm, no murmur Chest: No wheeze/ rales/ dullness; no accessory muscle use, no nasal flaring, no sternal retractions Abd.: Soft Non-tender, ND, BS +Body mass index  is 27.97 kg/m.  Ext: No clubbing cyanosis, edema Neuro:  normal strength, MAE x 4, A&O x 3, Forgetful Skin: No rashes, warm and dry, no lesions  Psych: normal mood and behavior   Assessment/Plan New diagnosis of squamous cell  carcinoma of the RML Current every day smoker  Plan Your biopsy did show lung cancer in the right middle lobe of your lung. Our plan is to send you to radiation oncology for treatment.  This is treatable with radiation.  You have appointments today at the cancer center at [facility] to be evaluated for treatment at 1:30. We will have our nurses help with making the transportation arrangements.  Please work on quitting smoking.  Call 1-800-QUIT NOW for free nicotine patches , gum or mints.  Follow up as needed. They will take great care of you at the cancer center.  Please contact office for sooner follow up if symptoms do not improve or worsen or seek emergency care   I spent 35 minutes dedicated to the care of this patient on the date of this encounter to include pre-visit review of records, face-to-face time with the patient discussing conditions above, post visit ordering of testing, clinical documentation with the electronic health record, making appropriate referrals as documented, and communicating necessary information to the patient's healthcare team.   Bevelyn Ngo, NP 07/26/2022  9:45 AM

## 2022-07-26 NOTE — Progress Notes (Signed)
Radiation Oncology         [phone] ________________________________  Initial outpatient Consultation - Conducted via telephone due to current COVID-19 concerns for limiting patient exposure  Name: Terry Fitzgerald MRN: [id number]  Date of Service: 07/26/2022 DOB: 08-15-1935  WU:[id number], Myra Gianotti, MD  Josephine Igo, DO   REFERRING PHYSICIAN: Josephine Igo, DO  DIAGNOSIS: There were no encounter diagnoses.  No diagnosis found.  HISTORY OF PRESENT ILLNESS: AXTIN Fitzgerald is a 87 y.o. male seen at the request of [name]. He was initially noted to have a 12 mm left lung apex nodule on neck CTA in 08/2019. He underwent further work up with PET scan on 02/17/20 showing decrease in size, as well as no hypermetabolism, to the LUL nodule. Since that time, he has continued chest CT scans every 6-12 months. On super D chest CT from 02/03/22, he was noted to have an enlarging right upper lobe nodule, currently 2 cm and previously 1.4 cm in 07/2021 and 0.7 cm in 07/2020. He underwent repeat PET scan on 03/23/22 showing: hypermetabolism to the RUL nodule; chronic mild hypermetabolism to a right paratracheal node, favored to be reactive; otherwise, no findings suspicious for metastatic disease.  After having to reschedule several times, he finally proceeded with bronchoscopy on 07/19/22 with [name]. Cytology from the RUL aspiration confirmed malignancy morphologically consistent with squamous cell carcinoma. Cytology from both the RUL brushings and a 4R lymph node were negative.  PREVIOUS RADIATION THERAPY: No  PAST MEDICAL HISTORY:  Past Medical History:  Diagnosis Date   BPH (benign prostatic hyperplasia)    Carotid artery disease (HCC)    S/p B endarterectomies, s/p R carotid stent. S/p R TCAR 2021   Coronary artery disease    s/p CABG 2010   Gout, unspecified    history x1   Hypertension    Myocardial infarction [facility]) 2011   Peripheral artery disease (HCC)    s/p L common iliac artery  angioplasty with stent placement 2010 for claudication; s/p L SFA angioplasty 2013      PAST SURGICAL HISTORY: Past Surgical History:  Procedure Laterality Date   APPENDECTOMY     BRONCHIAL BIOPSY  07/19/2022   Procedure: BRONCHIAL BIOPSIES;  Surgeon: Josephine Igo, DO;  [facility]: [facility];  Service: Pulmonary;;   BRONCHIAL BRUSHINGS  07/19/2022   Procedure: BRONCHIAL BRUSHINGS;  Surgeon: Josephine Igo, DO;  Location: [facility];  Service: Pulmonary;;   BRONCHIAL NEEDLE ASPIRATION BIOPSY  07/19/2022   Procedure: BRONCHIAL NEEDLE ASPIRATION BIOPSIES;  Surgeon: Josephine Igo, DO;  Location: [facility];  Service: Pulmonary;;   CATARACT EXTRACTION W/PHACO Right 05/27/2019   Procedure: CATARACT EXTRACTION PHACO AND INTRAOCULAR LENS PLACEMENT (IOC);  Surgeon: Fabio Pierce, MD;  Location: [facility];  Service: Ophthalmology;  Laterality: Right;  CDE: 15.37   CATARACT EXTRACTION W/PHACO Left 06/14/2019   Procedure: CATARACT EXTRACTION PHACO AND INTRAOCULAR LENS PLACEMENT LEFT EYE;  Surgeon: Fabio Pierce, MD;  Location: [facility];  Service: Ophthalmology;  Laterality: Left;  CDE: 20.79   CORONARY ARTERY BYPASS GRAFT     triple bypass 2011   FINE NEEDLE ASPIRATION  07/19/2022   Procedure: FINE NEEDLE ASPIRATION (FNA) LINEAR;  Surgeon: Josephine Igo, DO;  Location: [facility];  Service: Pulmonary;;   HERNIA REPAIR     Rih   TONSILLECTOMY     TRANSCAROTID ARTERY REVASCULARIZATION  Right 10/25/2019   Procedure: RIGHT TRANSCAROTID ARTERY REVASCULARIZATION;  Surgeon: Cephus Shelling, MD;  Location:  [facility] OR;  Service: Vascular;  Laterality: Right;   ULTRASOUND GUIDANCE FOR VASCULAR ACCESS Left 10/25/2019   Procedure: ULTRASOUND GUIDANCE FOR VASCULAR ACCESS;  Surgeon: Cephus Shelling, MD;  Location: [facility] OR;  Service: Vascular;  Laterality: Left;   VIDEO BRONCHOSCOPY WITH ENDOBRONCHIAL ULTRASOUND Bilateral 07/19/2022   Procedure: VIDEO BRONCHOSCOPY WITH ENDOBRONCHIAL ULTRASOUND;  Surgeon:  Josephine Igo, DO;  Location: [facility];  Service: Pulmonary;  Laterality: Bilateral;    FAMILY HISTORY: History reviewed. No pertinent family history.  SOCIAL HISTORY:  Social History   Socioeconomic History   Marital status: Widowed    Spouse name: Not on file   Number of children: Not on file   Years of education: Not on file   Highest education level: Not on file  Occupational History   Not on file  Tobacco Use   Smoking status: Every Day    Packs/day: 1.00    Years: 65.00    Additional pack years: 0.00    Total pack years: 65.00    Types: Cigarettes   Smokeless tobacco: Never   Tobacco comments:    Less than a 0.5ppd         HEI, 03/01/2022  Vaping Use   Vaping Use: Never used  Substance and Sexual Activity   Alcohol use: Not Currently   Drug use: Never   Sexual activity: Not Currently  Other Topics Concern   Not on file  Social History Narrative   Not on file   Social Determinants of Health   Financial Resource Strain: Not on file  Food Insecurity: Not on file  Transportation Needs: Not on file  Physical Activity: Not on file  Stress: Not on file  Social Connections: Not on file  Intimate Partner Violence: Not on file    ALLERGIES: Drug class [morphine and codeine]  MEDICATIONS:  Current Outpatient Medications  Medication Sig Dispense Refill   albuterol (ACCUNEB) 1.25 MG/3ML nebulizer solution Take 1.25 mg by nebulization every 6 (six) hours as needed for shortness of breath or wheezing.     aspirin 325 MG tablet Take 325 mg by mouth every evening.     carbamazepine (CARBATROL) 100 MG 12 hr capsule Take 100 mg by mouth daily. (Patient not taking: Reported on 07/26/2022)     clopidogrel (PLAVIX) 75 MG tablet Take 75 mg by mouth daily.     finasteride (PROSCAR) 5 MG tablet Take 5 mg by mouth every evening.     losartan (COZAAR) 100 MG tablet Take 100 mg by mouth daily.     lovastatin (MEVACOR) 20 MG tablet Take 20 mg by mouth in the morning.      lovastatin (MEVACOR) 40 MG tablet Take 1 tablet (40 mg total) by mouth at bedtime. (Patient not taking: Reported on 07/19/2022) 90 tablet 2   nicotine (NICODERM CQ - DOSED IN MG/24 HOURS) 21 mg/24hr patch Place 1 patch (21 mg total) onto the skin daily. (Patient not taking: Reported on 06/24/2022) 42 patch 0   pantoprazole (PROTONIX) 40 MG tablet Take 40 mg by mouth daily.     tamsulosin (FLOMAX) 0.4 MG CAPS capsule Take 0.4 mg by mouth daily.     No current facility-administered medications for this encounter.    REVIEW OF SYSTEMS:  On review of systems, the patient reports that he is doing well overall. He denies any chest pain, shortness of breath, cough,*** fevers, chills, night sweats, unintended weight changes. He denies any bowel or bladder disturbances, and denies abdominal pain, nausea or  vomiting. He denies any new musculoskeletal or joint aches or pains. A complete review of systems is obtained and is otherwise negative.    PHYSICAL EXAM:  Wt Readings from Last 3 Encounters:  07/26/22 178 lb (80.7 kg)  07/26/22 178 lb 9.6 oz (81 kg)  07/19/22 160 lb (72.6 kg)   Temp Readings from Last 3 Encounters:  07/26/22 97.9 F (36.6 C) (Temporal)  07/19/22 (!) 97.2 F (36.2 C) (Temporal)  06/24/22 (!) 97.5 F (36.4 C) (Oral)   BP Readings from Last 3 Encounters:  07/26/22 128/60  07/19/22 (!) 140/64  06/24/22 112/62   Pulse Readings from Last 3 Encounters:  07/26/22 63  07/19/22 (!) 55  06/24/22 62   Pain Assessment Pain Score: 0-No pain/10  In general this is a well appearing *** man in no acute distress. He's alert and oriented x4 and appropriate throughout the examination. Cardiopulmonary assessment is negative for acute distress and he exhibits normal effort.     KPS = ***  100 - Normal; no complaints; no evidence of disease. 90   - Able to carry on normal activity; minor signs or symptoms of disease. 80   - Normal activity with effort; some signs or symptoms of  disease. 35   - Cares for self; unable to carry on normal activity or to do active work. 60   - Requires occasional assistance, but is able to care for most of his personal needs. 50   - Requires considerable assistance and frequent medical care. 40   - Disabled; requires special care and assistance. 30   - Severely disabled; hospital admission is indicated although death not imminent. 20   - Very sick; hospital admission necessary; active supportive treatment necessary. 10   - Moribund; fatal processes progressing rapidly. 0     - Dead  Karnofsky DA, Abelmann WH, Craver LS and Burchenal [facility] [phone]) The use of the nitrogen mustards in the palliative treatment of carcinoma: with particular reference to bronchogenic carcinoma Cancer 1 634-56  LABORATORY DATA:  Lab Results  Component Value Date   WBC 8.5 07/19/2022   HGB 10.7 (L) 07/19/2022   HCT 33.6 (L) 07/19/2022   MCV 88.0 07/19/2022   PLT 250 07/19/2022   Lab Results  Component Value Date   NA 131 (L) 07/19/2022   K 4.5 07/19/2022   CL 103 07/19/2022   CO2 19 (L) 07/19/2022   Lab Results  Component Value Date   ALT 11 10/23/2019   AST 16 10/23/2019   ALKPHOS 66 10/23/2019   BILITOT 0.3 10/23/2019     RADIOGRAPHY: DG Chest Port 1 View  Result Date: 07/19/2022 CLINICAL DATA:  Status post bronchoscopy. EXAM: PORTABLE CHEST 1 VIEW COMPARISON:  Chest CT 05/10/2022 FINDINGS: The lungs are clear without focal pneumonia, edema, pneumothorax or pleural effusion. Ill-defined nodular densities at the lung bases compatible with the peripheral chronic interstitial disease seen on recent chest CT. Cardiopericardial silhouette is at upper limits of normal for size. No acute bony abnormality. Telemetry leads overlie the chest. IMPRESSION: No acute cardiopulmonary findings. Electronically Signed   By: Kennith Center M.D.   On: 07/19/2022 16:36   DG C-ARM BRONCHOSCOPY  Result Date: 07/19/2022 C-ARM BRONCHOSCOPY: Fluoroscopy was utilized by the  requesting physician.  No radiographic interpretation.      IMPRESSION/PLAN: This visit was conducted via telephone to spare the patient unnecessary potential exposure in the healthcare setting during the current COVID-19 pandemic. 1. 87 y.o. man with squamous cell carcinoma of  RUL***  Today, we talked to the patient and family about the findings and workup thus far. We discussed the natural history of squamous cell carcinoma and general treatment, highlighting the role of radiotherapy in the management. We discussed the available radiation techniques, and focused on the details and logistics of delivery. In his case, our recommendation is for 3 fractions of SBRT to the RUL nodule. We reviewed the anticipated acute and late sequelae associated with radiation in this setting. The patient was encouraged to ask questions that were answered to his satisfaction.  At the end of our conversation, the patient agreed to proceed with the recommended 3 fraction course of SBRT to the RUL lung nodule. He is tentatively scheduled for CT SIM at 10am on Friday 07/29/22.    Given current concerns for patient exposure during the COVID-19 pandemic, this encounter was conducted via telephone. The patient was notified in advance and was offered a WebEX meeting to allow for face to face communication but unfortunately reported that he did not have the appropriate resources/technology to support such a visit and instead preferred to proceed with telephone consult. The patient has given verbal consent for this type of encounter. The time spent during this encounter was *** minutes. The attendants for this meeting include Margaretmary Dys MD, Ashlyn Bruning PA-C, patient Branston Flint Silverstein {and ***.} During the encounter, Margaretmary Dys MD and Marcello Fennel PA-C were located at [facility] Radiation Oncology Department.  Patient Zakye Arnall Cielo {and *** were} was located at home.     Marguarite Arbour, PA-C     Margaretmary Dys, MD  [facility]  Radiation Oncology Direct Dial: [phone]  Fax: [phone] [email or web address]  Skype  LinkedIn   This document serves as a record of services personally performed by Margaretmary Dys, MD and Marcello Fennel, PA-C. It was created on their behalf by Mickie Bail, a trained medical scribe. The creation of this record is based on the scribe's personal observations and the provider's statements to them. This document has been checked and approved by the attending provider.

## 2022-07-26 NOTE — Patient Instructions (Addendum)
Your biopsy did show lung cancer in the right middle lobe of your lung. Our plan is to send you to radiation oncology for treatment.  This is treatable with radiation.  You have appointments today at the cancer center at [facility] to be evaluated for treatment at 1:30. We will have our nurses help with making the transportation arrangements.  Please work on quitting smoking.  Call 1-800-QUIT NOW for free nicotine patches , gum or mints.  Follow up as needed. They will take great care of you at the cancer center.  Please contact office for sooner follow up if symptoms do not improve or worsen or seek emergency care

## 2022-07-27 DIAGNOSIS — Z6827 Body mass index (BMI) 27.0-27.9, adult: Secondary | ICD-10-CM | POA: Diagnosis not present

## 2022-07-27 DIAGNOSIS — J440 Chronic obstructive pulmonary disease with acute lower respiratory infection: Secondary | ICD-10-CM | POA: Diagnosis not present

## 2022-07-27 DIAGNOSIS — N400 Enlarged prostate without lower urinary tract symptoms: Secondary | ICD-10-CM | POA: Diagnosis not present

## 2022-07-27 DIAGNOSIS — E782 Mixed hyperlipidemia: Secondary | ICD-10-CM | POA: Diagnosis not present

## 2022-07-27 DIAGNOSIS — I7389 Other specified peripheral vascular diseases: Secondary | ICD-10-CM | POA: Diagnosis not present

## 2022-07-27 DIAGNOSIS — Z0000 Encounter for general adult medical examination without abnormal findings: Secondary | ICD-10-CM | POA: Diagnosis not present

## 2022-07-27 DIAGNOSIS — R6889 Other general symptoms and signs: Secondary | ICD-10-CM | POA: Diagnosis not present

## 2022-07-27 DIAGNOSIS — M5459 Other low back pain: Secondary | ICD-10-CM | POA: Diagnosis not present

## 2022-07-27 DIAGNOSIS — I10 Essential (primary) hypertension: Secondary | ICD-10-CM | POA: Diagnosis not present

## 2022-07-28 ENCOUNTER — Other Ambulatory Visit: Payer: Self-pay

## 2022-07-28 DIAGNOSIS — C3411 Malignant neoplasm of upper lobe, right bronchus or lung: Secondary | ICD-10-CM | POA: Insufficient documentation

## 2022-07-28 NOTE — Progress Notes (Signed)
The proposed treatment discussed in conference is for discussion purpose only and is not a binding recommendation.  The patients have not been physically examined, or presented with their treatment options.  Therefore, final treatment plans cannot be decided.  

## 2022-07-29 ENCOUNTER — Ambulatory Visit
Admission: RE | Admit: 2022-07-29 | Discharge: 2022-07-29 | Disposition: A | Discharge status: Home / Self Care | Payer: Medicare HMO | Source: Ambulatory Visit | Attending: Radiation Oncology | Admitting: Radiation Oncology

## 2022-07-29 ENCOUNTER — Telehealth: Payer: Self-pay | Admitting: Radiation Oncology

## 2022-07-29 ENCOUNTER — Other Ambulatory Visit: Payer: Self-pay

## 2022-07-29 DIAGNOSIS — C3411 Malignant neoplasm of upper lobe, right bronchus or lung: Secondary | ICD-10-CM | POA: Insufficient documentation

## 2022-07-29 DIAGNOSIS — F17210 Nicotine dependence, cigarettes, uncomplicated: Secondary | ICD-10-CM | POA: Diagnosis not present

## 2022-07-29 DIAGNOSIS — R6889 Other general symptoms and signs: Secondary | ICD-10-CM | POA: Diagnosis not present

## 2022-07-29 NOTE — Telephone Encounter (Signed)
Spoke to daughter she didn't have any questions about father's care. She wanted to inform use that he needs 3 days notice to go to any doctors appt. She was also rude. But nothing further is needed.

## 2022-07-29 NOTE — Telephone Encounter (Addendum)
6/14 @ 4:36 pm Patient's daughter left voicemail for someone to call her back.  Spoke to patient's daughter she would like to change patient's treatments date/time due to have to set-up for transportation.  Called L1 machine no answer sent email to L1 machine and Jennifer/Brian so they are aware.

## 2022-07-29 NOTE — Progress Notes (Signed)
  Radiation Oncology         [phone]) [phone] ________________________________  Name: Terry Fitzgerald MRN: [id number]  Date: 07/29/2022  DOB: 22-Dec-1935  STEREOTACTIC BODY RADIOTHERAPY SIMULATION AND TREATMENT PLANNING NOTE    ICD-10-CM   1. Malignant neoplasm of right upper lobe of lung (HCC)  C34.11       DIAGNOSIS:   87 y.o. man with a 20 mm right upper lung squamous cell carcinoma  NARRATIVE:  The patient was brought to the CT Simulation planning suite.  Identity was confirmed.  All relevant records and images related to the planned course of therapy were reviewed.  The patient freely provided informed written consent to proceed with treatment after reviewing the details related to the planned course of therapy. The consent form was witnessed and verified by the simulation staff.  Then, the patient was set-up in a stable reproducible  supine position for radiation therapy.  A BodyFix immobilization pillow was fabricated for reproducible positioning.  Then I personally applied the abdominal compression paddle to limit respiratory excursion.  4D respiratoy motion management CT images were obtained.  Surface markings were placed.  The CT images were loaded into the planning software.  Then, using Cine, MIP, and standard views, the internal target volume (ITV) and planning target volumes (PTV) were delinieated, and avoidance structures were contoured.  Treatment planning then occurred.  The radiation prescription was entered and confirmed.  A total of two complex treatment devices were fabricated in the form of the BodyFix immobilization pillow and a neck accuform cushion.  I have requested : 3D Simulation  I have requested a DVH of the following structures: Heart, Lungs, Esophagus, Chest Wall, Brachial Plexus, Major Blood Vessels, and targets.  SPECIAL TREATMENT PROCEDURE:  The planned course of therapy using radiation constitutes a special treatment procedure. Special care is required in the  management of this patient for the following reasons. This treatment constitutes a Special Treatment Procedure for the following reason: [ High dose per fraction requiring special monitoring for increased toxicities of treatment including daily imaging..  The special nature of the planned course of radiotherapy will require increased physician supervision and oversight to ensure patient's safety with optimal treatment outcomes.  This requires extended time and effort.    RESPIRATORY MOTION MANAGEMENT SIMULATION:  In order to account for effect of respiratory motion on target structures and other organs in the planning and delivery of radiotherapy, this patient underwent respiratory motion management simulation.  To accomplish this, when the patient was brought to the CT simulation planning suite, 4D respiratoy motion management CT images were obtained.  The CT images were loaded into the planning software.  Then, using a variety of tools including Cine, MIP, and standard views, the target volume and planning target volumes (PTV) were delineated.  Avoidance structures were contoured.  Treatment planning then occurred.  Dose volume histograms were generated and reviewed for each of the requested structure.  The resulting plan was carefully reviewed and approved today.  PLAN:  The patient will receive 54 Gy in 3 fractions.  ________________________________  Artist Pais Kathrynn Running, M.D.

## 2022-08-08 DIAGNOSIS — F17210 Nicotine dependence, cigarettes, uncomplicated: Secondary | ICD-10-CM | POA: Diagnosis not present

## 2022-08-08 DIAGNOSIS — C3411 Malignant neoplasm of upper lobe, right bronchus or lung: Secondary | ICD-10-CM | POA: Diagnosis not present

## 2022-08-17 ENCOUNTER — Other Ambulatory Visit: Payer: Self-pay

## 2022-08-17 ENCOUNTER — Ambulatory Visit
Admission: RE | Admit: 2022-08-17 | Discharge: 2022-08-17 | Disposition: A | Discharge status: Home / Self Care | Payer: Medicare HMO | Source: Ambulatory Visit | Attending: Radiation Oncology | Admitting: Radiation Oncology

## 2022-08-17 DIAGNOSIS — C3411 Malignant neoplasm of upper lobe, right bronchus or lung: Secondary | ICD-10-CM | POA: Insufficient documentation

## 2022-08-17 DIAGNOSIS — R6889 Other general symptoms and signs: Secondary | ICD-10-CM | POA: Diagnosis not present

## 2022-08-17 LAB — RAD ONC ARIA SESSION SUMMARY
Course Elapsed Days: 0
Plan Fractions Treated to Date: 1
Plan Prescribed Dose Per Fraction: 18 Gy
Plan Total Fractions Prescribed: 3
Plan Total Prescribed Dose: 54 Gy
Reference Point Dosage Given to Date: 18 Gy
Reference Point Session Dosage Given: 18 Gy
Session Number: 1

## 2022-08-19 ENCOUNTER — Other Ambulatory Visit: Payer: Self-pay

## 2022-08-19 ENCOUNTER — Ambulatory Visit
Admission: RE | Admit: 2022-08-19 | Discharge: 2022-08-19 | Disposition: A | Discharge status: Home / Self Care | Payer: Medicare HMO | Source: Ambulatory Visit | Attending: Radiation Oncology | Admitting: Radiation Oncology

## 2022-08-19 ENCOUNTER — Ambulatory Visit: Payer: Medicare HMO

## 2022-08-19 ENCOUNTER — Telehealth: Payer: Self-pay | Admitting: Radiation Oncology

## 2022-08-19 ENCOUNTER — Ambulatory Visit: Payer: Medicare HMO | Admitting: Radiation Oncology

## 2022-08-19 DIAGNOSIS — C3411 Malignant neoplasm of upper lobe, right bronchus or lung: Secondary | ICD-10-CM | POA: Diagnosis not present

## 2022-08-19 DIAGNOSIS — R6889 Other general symptoms and signs: Secondary | ICD-10-CM | POA: Diagnosis not present

## 2022-08-19 LAB — RAD ONC ARIA SESSION SUMMARY
Course Elapsed Days: 2
Plan Fractions Treated to Date: 2
Plan Prescribed Dose Per Fraction: 18 Gy
Plan Total Fractions Prescribed: 3
Plan Total Prescribed Dose: 54 Gy
Reference Point Dosage Given to Date: 36 Gy
Reference Point Session Dosage Given: 18 Gy
Session Number: 2

## 2022-08-19 NOTE — Telephone Encounter (Signed)
Patient's daughter called to state patient had been dropped off at [facility] instead of [facility]. She stated he is trying to get transportation here from [facility]. She also stated she was not told of the age policy regarding her 88 year old child attending the appointments and therefore was unable to bring the patient as his transportation. Alerted the treatment team regarding possible late arrival.

## 2022-08-22 ENCOUNTER — Ambulatory Visit: Payer: Medicare HMO | Admitting: Radiation Oncology

## 2022-08-23 ENCOUNTER — Ambulatory Visit: Payer: Medicare HMO | Admitting: Radiation Oncology

## 2022-08-24 ENCOUNTER — Ambulatory Visit: Payer: Medicare HMO | Admitting: Radiation Oncology

## 2022-08-24 ENCOUNTER — Ambulatory Visit
Admission: RE | Admit: 2022-08-24 | Discharge: 2022-08-24 | Disposition: A | Discharge status: Home / Self Care | Payer: Medicare HMO | Source: Ambulatory Visit | Attending: Radiation Oncology | Admitting: Radiation Oncology

## 2022-08-24 ENCOUNTER — Other Ambulatory Visit: Payer: Self-pay

## 2022-08-24 DIAGNOSIS — C3411 Malignant neoplasm of upper lobe, right bronchus or lung: Secondary | ICD-10-CM | POA: Diagnosis not present

## 2022-08-24 DIAGNOSIS — R6889 Other general symptoms and signs: Secondary | ICD-10-CM | POA: Diagnosis not present

## 2022-08-24 DIAGNOSIS — Z510 Encounter for antineoplastic radiation therapy: Secondary | ICD-10-CM | POA: Diagnosis not present

## 2022-08-24 DIAGNOSIS — F17210 Nicotine dependence, cigarettes, uncomplicated: Secondary | ICD-10-CM | POA: Diagnosis not present

## 2022-08-24 LAB — RAD ONC ARIA SESSION SUMMARY
Course Elapsed Days: 7
Plan Fractions Treated to Date: 3
Plan Prescribed Dose Per Fraction: 18 Gy
Plan Total Fractions Prescribed: 3
Plan Total Prescribed Dose: 54 Gy
Reference Point Dosage Given to Date: 54 Gy
Reference Point Session Dosage Given: 18 Gy
Session Number: 3

## 2022-08-25 NOTE — Radiation Completion Notes (Addendum)
  Radiation Oncology         [phone] ________________________________  Name: Terry Fitzgerald MRN: [id number]  Date: 08/24/2022  DOB: 03/15/1935  Referring Physician: Audie Box, M.D. Date of Service: 2022-08-25 Radiation Oncologist: Margaretmary Bayley, M.D. [facility]     RADIATION ONCOLOGY END OF TREATMENT NOTE     Diagnosis:  87 y.o. man with squamous cell carcinoma of RUL.  GIven his age, he is not an ideal candidate for elective lobectomy, and represents a good candidate for SBRT  Intent: Curative     ==========DELIVERED PLANS==========  First Treatment Date: 2022-08-17 - Last Treatment Date: 2022-08-24   Plan Name: Lung_R_SBRT Site: Lung, Right Technique: SBRT/SRT-IMRT Mode: Photon Dose Per Fraction: 18 Gy Prescribed Dose (Delivered / Prescribed): 54 Gy / 54 Gy Prescribed Fxs (Delivered / Prescribed): 3 / 3     ==========ON TREATMENT VISIT DATES========== 2022-08-17, 2022-08-19, 2022-08-24, 2022-08-24    See weekly On Treatment Notes in Epic for details.  He tolerated the radiation treatment relatively well with only modest fatigue.  The patient will receive a call in about one month from the radiation oncology department and we will plan for a post-treatment CT Chest scan in 11/2022 with a telephone follow up visit thereafter to review results and recommendations and will keep his pulmonologist, [name], informed as well.  ------------------------------------------------   Margaretmary Dys, MD [facility]  Radiation Oncology Direct Dial: [phone]  Fax: [phone] [email or web address]  Skype  LinkedIn

## 2022-08-26 ENCOUNTER — Ambulatory Visit: Payer: Medicare HMO | Admitting: Radiation Oncology

## 2022-09-07 ENCOUNTER — Telehealth: Payer: Self-pay | Admitting: Radiation Oncology

## 2022-09-07 NOTE — Telephone Encounter (Signed)
Daughter of the patient called inquiring any follow up appointments. I informed her of the patient's upcoming post treatment call appointment scheduled 10/04/22. The daughter stated if the provider is unable to contact the patient to give her a call at [phone].

## 2022-10-04 ENCOUNTER — Ambulatory Visit
Admission: RE | Admit: 2022-10-04 | Discharge: 2022-10-04 | Disposition: A | Discharge status: Home / Self Care | Payer: Medicare HMO | Source: Ambulatory Visit | Attending: Radiation Oncology | Admitting: Radiation Oncology

## 2022-10-04 NOTE — Progress Notes (Addendum)
  Radiation Oncology         [phone]) [phone] ________________________________  Name: Terry Fitzgerald MRN: [id number]  Date of Service: 10/04/2022  DOB: May 19, 1935  Post Treatment Telephone Note  Diagnosis:  C34.11 Malignant neoplasm of upper lobe, right bronchus or lung (as documented in provider EOT note)   The patient was not available for call today. Voicemail left.  The patient has scheduled follow up with his medical oncologist [name] for ongoing care, and was encouraged to call if he develops concerns or questions regarding radiation.    Ruel Favors, LPN

## 2022-10-07 ENCOUNTER — Other Ambulatory Visit: Payer: Self-pay | Admitting: Urology

## 2022-10-07 DIAGNOSIS — C3411 Malignant neoplasm of upper lobe, right bronchus or lung: Secondary | ICD-10-CM

## 2022-10-07 NOTE — Addendum Note (Signed)
Encounter addended by: Marcello Fennel, PA-C on: 10/07/2022 2:04 PM  Actions taken: Clinical Note Signed, Visit diagnoses modified

## 2022-10-19 ENCOUNTER — Telehealth: Payer: Self-pay | Admitting: Cardiology

## 2022-10-19 NOTE — Telephone Encounter (Signed)
Pt is calling to get a refill on isosorbide and it is not listed in the med list or history. Please advise.

## 2022-10-19 NOTE — Telephone Encounter (Signed)
Please disregard the phone note. This was a error

## 2022-12-08 ENCOUNTER — Ambulatory Visit: Payer: Medicare HMO | Admitting: Nurse Practitioner

## 2022-12-09 ENCOUNTER — Telehealth: Payer: Self-pay | Admitting: *Deleted

## 2022-12-09 NOTE — Telephone Encounter (Signed)
RETURNED PATIENT'S PHONE CALL, LVM FOR A RETURN CALL 

## 2022-12-13 ENCOUNTER — Telehealth: Payer: Self-pay | Admitting: *Deleted

## 2022-12-13 NOTE — Telephone Encounter (Signed)
RETURNED PATIENT'S PHONE CALL, LVM FOR A RETURN CALL 

## 2022-12-14 ENCOUNTER — Ambulatory Visit: Payer: Medicare HMO | Admitting: Urology

## 2022-12-15 ENCOUNTER — Telehealth: Payer: Self-pay | Admitting: *Deleted

## 2022-12-15 NOTE — Telephone Encounter (Signed)
RETURNED PATIENT'S DAUGHTER'S PHONE CALL, NO ANSWER, UNABLE TO LEAVE MESSAGE DUE TO VM BEING FULL

## 2022-12-22 DIAGNOSIS — I7389 Other specified peripheral vascular diseases: Secondary | ICD-10-CM | POA: Diagnosis not present

## 2022-12-22 DIAGNOSIS — R6889 Other general symptoms and signs: Secondary | ICD-10-CM | POA: Diagnosis not present

## 2022-12-22 DIAGNOSIS — N400 Enlarged prostate without lower urinary tract symptoms: Secondary | ICD-10-CM | POA: Diagnosis not present

## 2022-12-22 DIAGNOSIS — E782 Mixed hyperlipidemia: Secondary | ICD-10-CM | POA: Diagnosis not present

## 2022-12-22 DIAGNOSIS — J440 Chronic obstructive pulmonary disease with acute lower respiratory infection: Secondary | ICD-10-CM | POA: Diagnosis not present

## 2022-12-22 DIAGNOSIS — Z6827 Body mass index (BMI) 27.0-27.9, adult: Secondary | ICD-10-CM | POA: Diagnosis not present

## 2022-12-22 DIAGNOSIS — M545 Low back pain, unspecified: Secondary | ICD-10-CM | POA: Diagnosis not present

## 2022-12-22 DIAGNOSIS — N182 Chronic kidney disease, stage 2 (mild): Secondary | ICD-10-CM | POA: Diagnosis not present

## 2022-12-22 DIAGNOSIS — I10 Essential (primary) hypertension: Secondary | ICD-10-CM | POA: Diagnosis not present

## 2023-01-04 ENCOUNTER — Telehealth: Payer: Self-pay | Admitting: *Deleted

## 2023-01-04 NOTE — Telephone Encounter (Signed)
RETURNED PATIENT'S DAUIGHTER'S PHONE CALL, SPOKE WITH PATIENT'S DAUGHTER- Terry Fitzgerald

## 2023-01-10 DIAGNOSIS — J432 Centrilobular emphysema: Secondary | ICD-10-CM | POA: Diagnosis not present

## 2023-01-10 DIAGNOSIS — C3491 Malignant neoplasm of unspecified part of right bronchus or lung: Secondary | ICD-10-CM | POA: Diagnosis not present

## 2023-01-10 DIAGNOSIS — I700 Atherosclerosis of aorta: Secondary | ICD-10-CM | POA: Diagnosis not present

## 2023-01-10 DIAGNOSIS — C3411 Malignant neoplasm of upper lobe, right bronchus or lung: Secondary | ICD-10-CM | POA: Diagnosis not present

## 2023-01-10 DIAGNOSIS — R6889 Other general symptoms and signs: Secondary | ICD-10-CM | POA: Diagnosis not present

## 2023-01-10 DIAGNOSIS — J8410 Pulmonary fibrosis, unspecified: Secondary | ICD-10-CM | POA: Diagnosis not present

## 2023-01-24 ENCOUNTER — Ambulatory Visit
Admission: RE | Admit: 2023-01-24 | Discharge: 2023-01-24 | Disposition: A | Discharge status: Home / Self Care | Payer: Medicare HMO | Source: Ambulatory Visit | Attending: Urology | Admitting: Urology

## 2023-01-24 DIAGNOSIS — C3411 Malignant neoplasm of upper lobe, right bronchus or lung: Secondary | ICD-10-CM | POA: Diagnosis not present

## 2023-01-24 DIAGNOSIS — F17210 Nicotine dependence, cigarettes, uncomplicated: Secondary | ICD-10-CM | POA: Diagnosis not present

## 2023-01-24 NOTE — Progress Notes (Signed)
Radiation Oncology         ([phone]) [phone] ________________________________  Name: Terry Fitzgerald MRN: [id number]  Date: 01/24/2023  DOB: [birth year]  Post Treatment Note  CC: Toma Deiters, MD  Josephine Igo, DO  Diagnosis:   87 y.o. man with a 20 mm right upper lung squamous cell carcinoma   Interval Since Last Radiation:  5 months  08/17/22 - 08/24/22: SBRT// The RUL lung nodule was treated to 54 Gy in 3 fractions.  Narrative:  I spoke with the patient and his daughter, Terry Fitzgerald, to conduct his routine scheduled follow up visit to review his posttreatment CT chest scan via telephone to spare the patient unnecessary potential exposure in the healthcare setting during the current COVID-19 pandemic.  The patient was notified in advance and gave permission to proceed with this visit format.      He tolerated the radiation treatments relatively well despite modest fatigue.                            On review of systems, the patient states that he is doing well in general and is gradually regaining his energy and strength.  He specifically denies any increased shortness of breath, productive cough, chest pain or hemoptysis.  He has not had recent fever, chills or night sweats.  Overall, he is pleased with his progress to date.  ALLERGIES:  is allergic to drug class [morphine and codeine].  Meds: Current Outpatient Medications  Medication Sig Dispense Refill   albuterol (ACCUNEB) 1.25 MG/3ML nebulizer solution Take 1.25 mg by nebulization every 6 (six) hours as needed for shortness of breath or wheezing.     aspirin 325 MG tablet Take 325 mg by mouth every evening.     carbamazepine (CARBATROL) 100 MG 12 hr capsule Take 100 mg by mouth daily. (Patient not taking: Reported on 07/26/2022)     clopidogrel (PLAVIX) 75 MG tablet Take 75 mg by mouth daily.     finasteride (PROSCAR) 5 MG tablet Take 5 mg by mouth every evening.     losartan (COZAAR) 100 MG tablet Take 100 mg by mouth daily.      lovastatin (MEVACOR) 20 MG tablet Take 20 mg by mouth in the morning.     lovastatin (MEVACOR) 40 MG tablet Take 1 tablet (40 mg total) by mouth at bedtime. (Patient not taking: Reported on 07/19/2022) 90 tablet 2   nicotine (NICODERM CQ - DOSED IN MG/24 HOURS) 21 mg/24hr patch Place 1 patch (21 mg total) onto the skin daily. (Patient not taking: Reported on 06/24/2022) 42 patch 0   pantoprazole (PROTONIX) 40 MG tablet Take 40 mg by mouth daily.     tamsulosin (FLOMAX) 0.4 MG CAPS capsule Take 0.4 mg by mouth daily.     No current facility-administered medications for this encounter.    Physical Findings:  vitals were not taken for this visit.   /10 Unable to assess due to telephone follow-up visit format.  Lab Findings: Lab Results  Component Value Date   WBC 8.5 07/19/2022   HGB 10.7 (L) 07/19/2022   HCT 33.6 (L) 07/19/2022   MCV 88.0 07/19/2022   PLT 250 07/19/2022     Radiographic Findings: No results found.  Impression/Plan: 1. 87 y.o. man with a 20 mm right upper lung squamous cell carcinoma. He appears to be recovering well from the effects of his SBRT and is currently without complaints.  We reviewed his  recent posttreatment CT chest scan that was performed on 01/10/2023 at [facility] in [place]. This scan shows resolution of the treated RUL lung nodule and no new nodules or findings to suggest disease recurrence or progression.  Therefore, we will continue with serial CT chest imaging at [facility], every 6 months for a total of 5 years, at which time we will then switch to annual low-dose CT imaging.  I will plan to call him following each scan to review the results and any recommendations.  He and his daughter know that they are welcome to call at anytime in the interim with any questions or concerns.   I personally spent 20 minutes in this encounter including chart review, reviewing radiological studies, telephone conversation with the patient, entering orders,  coordinating care and completing documentation.    Marguarite Arbour, PA-C

## 2023-01-25 ENCOUNTER — Ambulatory Visit: Payer: Medicare HMO | Admitting: Radiation Oncology

## 2023-01-25 ENCOUNTER — Ambulatory Visit: Payer: Medicare HMO | Admitting: Urology

## 2023-04-04 DIAGNOSIS — I10 Essential (primary) hypertension: Secondary | ICD-10-CM | POA: Diagnosis not present

## 2023-04-04 DIAGNOSIS — I7389 Other specified peripheral vascular diseases: Secondary | ICD-10-CM | POA: Diagnosis not present

## 2023-04-04 DIAGNOSIS — G3184 Mild cognitive impairment, so stated: Secondary | ICD-10-CM | POA: Diagnosis not present

## 2023-04-04 DIAGNOSIS — E782 Mixed hyperlipidemia: Secondary | ICD-10-CM | POA: Diagnosis not present

## 2023-04-04 DIAGNOSIS — N182 Chronic kidney disease, stage 2 (mild): Secondary | ICD-10-CM | POA: Diagnosis not present

## 2023-04-04 DIAGNOSIS — N400 Enlarged prostate without lower urinary tract symptoms: Secondary | ICD-10-CM | POA: Diagnosis not present

## 2023-04-04 DIAGNOSIS — M545 Low back pain, unspecified: Secondary | ICD-10-CM | POA: Diagnosis not present

## 2023-04-04 DIAGNOSIS — D518 Other vitamin B12 deficiency anemias: Secondary | ICD-10-CM | POA: Diagnosis not present

## 2023-04-04 DIAGNOSIS — F331 Major depressive disorder, recurrent, moderate: Secondary | ICD-10-CM | POA: Diagnosis not present

## 2023-04-04 DIAGNOSIS — J440 Chronic obstructive pulmonary disease with acute lower respiratory infection: Secondary | ICD-10-CM | POA: Diagnosis not present

## 2023-04-24 DIAGNOSIS — R059 Cough, unspecified: Secondary | ICD-10-CM | POA: Diagnosis not present

## 2023-04-24 DIAGNOSIS — R051 Acute cough: Secondary | ICD-10-CM | POA: Diagnosis not present

## 2023-04-24 DIAGNOSIS — Z720 Tobacco use: Secondary | ICD-10-CM | POA: Diagnosis not present

## 2023-04-24 DIAGNOSIS — Z20822 Contact with and (suspected) exposure to covid-19: Secondary | ICD-10-CM | POA: Diagnosis not present

## 2023-04-24 DIAGNOSIS — I10 Essential (primary) hypertension: Secondary | ICD-10-CM | POA: Diagnosis not present

## 2023-04-24 DIAGNOSIS — J101 Influenza due to other identified influenza virus with other respiratory manifestations: Secondary | ICD-10-CM | POA: Diagnosis not present

## 2023-04-24 DIAGNOSIS — R0989 Other specified symptoms and signs involving the circulatory and respiratory systems: Secondary | ICD-10-CM | POA: Diagnosis not present

## 2023-04-24 DIAGNOSIS — J189 Pneumonia, unspecified organism: Secondary | ICD-10-CM | POA: Diagnosis not present

## 2023-04-24 DIAGNOSIS — J441 Chronic obstructive pulmonary disease with (acute) exacerbation: Secondary | ICD-10-CM | POA: Diagnosis not present

## 2023-04-24 DIAGNOSIS — F17210 Nicotine dependence, cigarettes, uncomplicated: Secondary | ICD-10-CM | POA: Diagnosis not present

## 2023-04-24 DIAGNOSIS — Z885 Allergy status to narcotic agent status: Secondary | ICD-10-CM | POA: Diagnosis not present

## 2023-04-29 DIAGNOSIS — J449 Chronic obstructive pulmonary disease, unspecified: Secondary | ICD-10-CM | POA: Diagnosis not present

## 2023-04-29 DIAGNOSIS — Z792 Long term (current) use of antibiotics: Secondary | ICD-10-CM | POA: Diagnosis not present

## 2023-04-29 DIAGNOSIS — J189 Pneumonia, unspecified organism: Secondary | ICD-10-CM | POA: Diagnosis not present

## 2023-04-29 DIAGNOSIS — Z85118 Personal history of other malignant neoplasm of bronchus and lung: Secondary | ICD-10-CM | POA: Diagnosis not present

## 2023-04-29 DIAGNOSIS — R54 Age-related physical debility: Secondary | ICD-10-CM | POA: Diagnosis not present

## 2023-04-29 DIAGNOSIS — Z7951 Long term (current) use of inhaled steroids: Secondary | ICD-10-CM | POA: Diagnosis not present

## 2023-04-29 DIAGNOSIS — D649 Anemia, unspecified: Secondary | ICD-10-CM | POA: Diagnosis not present

## 2023-04-29 DIAGNOSIS — I2510 Atherosclerotic heart disease of native coronary artery without angina pectoris: Secondary | ICD-10-CM | POA: Diagnosis not present

## 2023-04-29 DIAGNOSIS — J1008 Influenza due to other identified influenza virus with other specified pneumonia: Secondary | ICD-10-CM | POA: Diagnosis not present

## 2023-04-29 DIAGNOSIS — N179 Acute kidney failure, unspecified: Secondary | ICD-10-CM | POA: Diagnosis not present

## 2023-04-29 DIAGNOSIS — J101 Influenza due to other identified influenza virus with other respiratory manifestations: Secondary | ICD-10-CM | POA: Diagnosis not present

## 2023-04-29 DIAGNOSIS — Z720 Tobacco use: Secondary | ICD-10-CM | POA: Diagnosis not present

## 2023-04-29 DIAGNOSIS — R590 Localized enlarged lymph nodes: Secondary | ICD-10-CM | POA: Diagnosis not present

## 2023-04-29 DIAGNOSIS — I10 Essential (primary) hypertension: Secondary | ICD-10-CM | POA: Diagnosis not present

## 2023-04-29 DIAGNOSIS — Z79899 Other long term (current) drug therapy: Secondary | ICD-10-CM | POA: Diagnosis not present

## 2023-04-29 DIAGNOSIS — J159 Unspecified bacterial pneumonia: Secondary | ICD-10-CM | POA: Diagnosis not present

## 2023-04-29 DIAGNOSIS — J1100 Influenza due to unidentified influenza virus with unspecified type of pneumonia: Secondary | ICD-10-CM | POA: Diagnosis not present

## 2023-04-29 DIAGNOSIS — J440 Chronic obstructive pulmonary disease with acute lower respiratory infection: Secondary | ICD-10-CM | POA: Diagnosis not present

## 2023-04-29 DIAGNOSIS — I4891 Unspecified atrial fibrillation: Secondary | ICD-10-CM | POA: Diagnosis not present

## 2023-04-29 DIAGNOSIS — R5381 Other malaise: Secondary | ICD-10-CM | POA: Diagnosis not present

## 2023-04-29 DIAGNOSIS — C3411 Malignant neoplasm of upper lobe, right bronchus or lung: Secondary | ICD-10-CM | POA: Diagnosis not present

## 2023-04-29 DIAGNOSIS — J432 Centrilobular emphysema: Secondary | ICD-10-CM | POA: Diagnosis not present

## 2023-04-29 DIAGNOSIS — R6520 Severe sepsis without septic shock: Secondary | ICD-10-CM | POA: Diagnosis not present

## 2023-04-29 DIAGNOSIS — A419 Sepsis, unspecified organism: Secondary | ICD-10-CM | POA: Diagnosis not present

## 2023-04-29 DIAGNOSIS — I739 Peripheral vascular disease, unspecified: Secondary | ICD-10-CM | POA: Diagnosis not present

## 2023-04-29 DIAGNOSIS — Z8709 Personal history of other diseases of the respiratory system: Secondary | ICD-10-CM | POA: Diagnosis not present

## 2023-05-04 ENCOUNTER — Ambulatory Visit: Payer: Medicare HMO | Admitting: Internal Medicine

## 2023-05-08 ENCOUNTER — Telehealth: Payer: Self-pay

## 2023-05-08 NOTE — Transitions of Care (Post Inpatient/ED Visit) (Signed)
 05/08/2023  Name: Terry Fitzgerald MRN: [id number] DOB: Oct 03, 1935  Today's TOC FU Call Status: Today's TOC FU Call Status:: Successful TOC FU Call Completed TOC FU Call Complete Date: 05/08/23 Patient's Name and Date of Birth confirmed.  Transition Care Management Follow-up Telephone Call Date of Discharge: 05/05/23 Discharge Facility: Other [facility]) Name of Other (Non[facility]) Discharge Facility: [facility] Type of Discharge: Inpatient Admission Primary Inpatient Discharge Diagnosis:: Pneumonia How have you been since you were released from the hospital?: Better Any questions or concerns?: No  Items Reviewed: Did you receive and understand the discharge instructions provided?: Yes Medications obtained,verified, and reconciled?: Yes (Medications Reviewed) Any new allergies since your discharge?: No Dietary orders reviewed?: Yes Type of Diet Ordered:: Heart Healthy Do you have support at home?: Yes People in Home: child(ren), adult Name of Support/Comfort Primary Source: Renaye Rakers  Medications Reviewed Today: Medications Reviewed Today     Reviewed by Fleeta Emmer, RN (Case Manager) on 05/08/23 at 1129  Med List Status: <None>   Medication Order Taking? Sig Documenting Provider Last Dose Status Informant  albuterol (ACCUNEB) 1.25 MG/3ML nebulizer solution [id number] Yes Take 1.25 mg by nebulization every 6 (six) hours as needed for shortness of breath or wheezing. [provider] Taking Active Self, Pharmacy Records, Family Member  amoxicillin-clavulanate (AUGMENTIN) 875-125 MG tablet [id number] Yes Take 1 tablet by mouth 2 (two) times daily. [provider]  Active   aspirin 325 MG tablet [id number] Yes Take 325 mg by mouth every evening. [provider] Taking Active Self, Pharmacy Records, Family Member  carbamazepine (CARBATROL) 100 MG 12 hr capsule [id number] No Take 100 mg by mouth daily.  Patient not taking: Reported on 07/26/2022    [provider] Not Taking Active Family Member, Self, Pharmacy Records  clopidogrel (PLAVIX) 75 MG tablet [id number] Yes Take 75 mg by mouth daily. [provider] Taking Active Self, Pharmacy Records, Family Member  diltiazem (CARDIZEM CD) 120 MG 24 hr capsule [id number] Yes Take 1 capsule by mouth daily. [provider]  Active   finasteride (PROSCAR) 5 MG tablet [id number] Yes Take 5 mg by mouth every evening. [provider] Taking Active Self, Pharmacy Records, Family Member  losartan (COZAAR) 100 MG tablet [id number] Yes Take 100 mg by mouth daily. [provider] Taking Active Self, Pharmacy Records, Family Member  lovastatin (MEVACOR) 20 MG tablet [id number] Yes Take 20 mg by mouth in the morning. [provider] Taking Active Self, Pharmacy Records, Family Member  lovastatin (MEVACOR) 40 MG tablet [id number] No Take 1 tablet (40 mg total) by mouth at bedtime.  Patient not taking: Reported on 07/19/2022   Antoine Poche, MD Not Taking Active Self, Pharmacy Records, Family Member  metoprolol tartrate (LOPRESSOR) 25 MG tablet [id number] Yes Take by mouth. [provider]  Active   nicotine (NICODERM CQ - DOSED IN MG/24 HOURS) 21 mg/24hr patch [id number] No Place 1 patch (21 mg total) onto the skin daily.  Patient not taking: Reported on 06/24/2022   Noemi Chapel, NP Not Taking Active Self, Pharmacy Records, Family Member  pantoprazole (PROTONIX) 40 MG tablet [id number] No Take 40 mg by mouth daily.  Patient not taking: Reported on 05/08/2023   [provider] Not Taking Active Self, Pharmacy Records, Family Member  tamsulosin [facility]) 0.4 MG CAPS capsule [id number] Yes Take 0.4 mg by mouth daily. [provider] Taking Active Self, Pharmacy Records, Family Member            Home  Care and Equipment/Supplies: Were Home Health Services Ordered?: Yes Name of Home Health Agency:: [facility] Has Agency set up a  time to come to your home?: No EMR reviewed for Home Health Orders: Orders present/patient has not received call (refer to CM for follow-up)  Functional Questionnaire: Do you need assistance with bathing/showering or dressing?: Yes Do you need assistance with meal preparation?: Yes Do you need assistance with eating?: No Do you have difficulty maintaining continence: No Do you need assistance with getting out of bed/getting out of a chair/moving?: No  Follow up appointments reviewed: PCP Follow-up appointment confirmed?: Yes Date of PCP follow-up appointment?: 05/18/23 Follow-up Provider: Patient to establish with Dr. Terrall Laity Follow-up appointment confirmed?: NA Do you need transportation to your follow-up appointment?: No Do you understand care options if your condition(s) worsen?: Yes-patient verbalized understanding  SDOH Interventions Today    Flowsheet Row Most Recent Value  SDOH Interventions   Food Insecurity Interventions Intervention Not Indicated  Housing Interventions Intervention Not Indicated  Transportation Interventions Intervention Not Indicated  Utilities Interventions Intervention Not Indicated      Spoke with daughter Hoback).  She states patient is doing much better but he is weak.  She states he still has a cough but is so much better.  Discussed signs of worsening pneumonia and when to notify physician.  She is primary caregiver for her dad and they live together with other family. She states he utilizes the walker for ambulation and needs assistance with care, which she provides.  She states that he will be establishing care with a new PCP-Dr. [facility] on 05-18-23. She declined 30 day TOC as she has a lot on her plate taking care of her dad, home health starting tomorrow and personal things.     Bary Leriche RN, MSN [facility], [facility] RN Care Manager Direct Dial: [phone]  Fax:  [phone] Website: [email or web address]

## 2023-05-08 NOTE — Patient Instructions (Signed)
 Visit Information  Thank you for taking time to visit with me today. Please don't hesitate to contact me if I can be of assistance to you.   If you are experiencing a Mental Health or Behavioral Health Crisis or need someone to talk to, please call the Suicide and Crisis Lifeline: 988   The patient verbalized understanding of instructions, educational materials, and care plan provided today and DECLINED offer to receive copy of patient instructions, educational materials, and care plan.   The patient has been provided with contact information for the care management team and has been advised to call with any health related questions or concerns.    Bary Leriche RN, MSN [facility], [facility] Health RN Care Manager Direct Dial: [phone]  Fax: [phone] Website: [email or web address]

## 2023-05-15 DIAGNOSIS — I4891 Unspecified atrial fibrillation: Secondary | ICD-10-CM | POA: Diagnosis not present

## 2023-05-15 DIAGNOSIS — J188 Other pneumonia, unspecified organism: Secondary | ICD-10-CM | POA: Diagnosis not present

## 2023-05-15 DIAGNOSIS — C3411 Malignant neoplasm of upper lobe, right bronchus or lung: Secondary | ICD-10-CM | POA: Diagnosis not present

## 2023-05-15 DIAGNOSIS — A419 Sepsis, unspecified organism: Secondary | ICD-10-CM | POA: Diagnosis not present

## 2023-05-15 DIAGNOSIS — J1008 Influenza due to other identified influenza virus with other specified pneumonia: Secondary | ICD-10-CM | POA: Diagnosis not present

## 2023-05-15 DIAGNOSIS — N179 Acute kidney failure, unspecified: Secondary | ICD-10-CM | POA: Diagnosis not present

## 2023-05-15 DIAGNOSIS — R6521 Severe sepsis with septic shock: Secondary | ICD-10-CM | POA: Diagnosis not present

## 2023-05-15 DIAGNOSIS — F0390 Unspecified dementia without behavioral disturbance: Secondary | ICD-10-CM | POA: Diagnosis not present

## 2023-05-15 DIAGNOSIS — N178 Other acute kidney failure: Secondary | ICD-10-CM | POA: Diagnosis not present

## 2023-05-15 DIAGNOSIS — I119 Hypertensive heart disease without heart failure: Secondary | ICD-10-CM | POA: Diagnosis not present

## 2023-05-16 DIAGNOSIS — J188 Other pneumonia, unspecified organism: Secondary | ICD-10-CM | POA: Diagnosis not present

## 2023-05-16 DIAGNOSIS — R6521 Severe sepsis with septic shock: Secondary | ICD-10-CM | POA: Diagnosis not present

## 2023-05-16 DIAGNOSIS — A419 Sepsis, unspecified organism: Secondary | ICD-10-CM | POA: Diagnosis not present

## 2023-05-16 DIAGNOSIS — J1008 Influenza due to other identified influenza virus with other specified pneumonia: Secondary | ICD-10-CM | POA: Diagnosis not present

## 2023-05-16 DIAGNOSIS — I4891 Unspecified atrial fibrillation: Secondary | ICD-10-CM | POA: Diagnosis not present

## 2023-05-16 DIAGNOSIS — I119 Hypertensive heart disease without heart failure: Secondary | ICD-10-CM | POA: Diagnosis not present

## 2023-05-16 DIAGNOSIS — N179 Acute kidney failure, unspecified: Secondary | ICD-10-CM | POA: Diagnosis not present

## 2023-05-16 DIAGNOSIS — C3411 Malignant neoplasm of upper lobe, right bronchus or lung: Secondary | ICD-10-CM | POA: Diagnosis not present

## 2023-05-16 DIAGNOSIS — F0390 Unspecified dementia without behavioral disturbance: Secondary | ICD-10-CM | POA: Diagnosis not present

## 2023-06-06 DIAGNOSIS — J1008 Influenza due to other identified influenza virus with other specified pneumonia: Secondary | ICD-10-CM | POA: Diagnosis not present

## 2023-06-06 DIAGNOSIS — I4891 Unspecified atrial fibrillation: Secondary | ICD-10-CM | POA: Diagnosis not present

## 2023-06-06 DIAGNOSIS — A419 Sepsis, unspecified organism: Secondary | ICD-10-CM | POA: Diagnosis not present

## 2023-06-06 DIAGNOSIS — I119 Hypertensive heart disease without heart failure: Secondary | ICD-10-CM | POA: Diagnosis not present

## 2023-06-06 DIAGNOSIS — N179 Acute kidney failure, unspecified: Secondary | ICD-10-CM | POA: Diagnosis not present

## 2023-06-06 DIAGNOSIS — F0390 Unspecified dementia without behavioral disturbance: Secondary | ICD-10-CM | POA: Diagnosis not present

## 2023-06-06 DIAGNOSIS — J188 Other pneumonia, unspecified organism: Secondary | ICD-10-CM | POA: Diagnosis not present

## 2023-06-06 DIAGNOSIS — R6521 Severe sepsis with septic shock: Secondary | ICD-10-CM | POA: Diagnosis not present

## 2023-06-06 DIAGNOSIS — C3411 Malignant neoplasm of upper lobe, right bronchus or lung: Secondary | ICD-10-CM | POA: Diagnosis not present

## 2023-06-29 NOTE — Progress Notes (Deleted)
 Terry Fitzgerald, male    DOB: [birth year], 88 y.o.   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  Positive BD   History of Present Illness  01/01/2020  Pulmonary/ 1st office eval/ Evertt Chouinard / [facility]  Chief Complaint  Patient presents with   Pulmonary Consult    Referred by Dr. Jimmye Moulds for eval of pulmonary nodule noted on CT Angio neck performed 09/09/2019.  He states has occ cough with clear sputum.    Dyspnea: push mower / rake leaves/ [employer] walking  Cough: minimal smoker's hack x 3-4 x and good for the day/ min mucoid Sleep: able to lie flat / one pillow SABA use: none  Rec My office will call to schedule you a PET scan at your soonest convenience at [facility] and also a PFT same hospital  - same day if possible  Dx : sq cell ca RML > SBRT completed 08/24/22  No follow up needed at this office    06/30/2023  f/u ov/Braleigh Massoud re: ***   maint on ***  No chief complaint on file.   Dyspnea:  *** Cough: *** Sleeping: *** resp cc  SABA use: *** 02: ***  Lung cancer screening :  ***    No obvious day to day or daytime variability or assoc excess/ purulent sputum or mucus plugs or hemoptysis or cp or chest tightness, subjective wheeze or overt sinus or hb symptoms.    Also denies any obvious fluctuation of symptoms with weather or environmental changes or other aggravating or alleviating factors except as outlined above   No unusual exposure hx or h/o childhood pna/ asthma or knowledge of premature birth.  Current Allergies, Complete Past Medical History, Past Surgical History, Family History, and Social History were reviewed in [employer] record.  ROS   The following are not active complaints unless bolded Hoarseness, sore throat, dysphagia, dental problems, itching, sneezing,  nasal congestion or discharge of excess mucus or purulent secretions, ear ache,   fever, chills, sweats, unintended wt loss or wt gain, classically pleuritic or exertional cp,  orthopnea pnd or arm/hand swelling  or leg swelling, presyncope, palpitations, abdominal pain, anorexia, nausea, vomiting, diarrhea  or change in bowel habits or change in bladder habits, change in stools or change in urine, dysuria, hematuria,  rash, arthralgias, visual complaints, headache, numbness, weakness or ataxia or problems with walking or coordination,  change in mood or  memory.        No outpatient medications have been marked as taking for the 06/30/23 encounter (Appointment) with Diamond Formica, MD.               Past Medical History:  Diagnosis Date   BPH (benign prostatic hyperplasia)    Gout, unspecified    history x1   Hypertension    Myocardial infarction [facility]) 2011      Objective:     Wts  06/30/2023       ***  07/26/22 178 lb (80.7 kg)  07/26/22 178 lb 9.6 oz (81 kg)  07/19/22 160 lb (72.6 kg)      Vital signs reviewed  06/30/2023  - Note at rest  02 sats  ***% on ***   General appearance:    ***         I personally reviewed images and agree with radiology impression as follows:   Chest CT cuts on neck scan 09/09/19  12 mm spiculated and suspicious lung nodule within the left lung apex.         Assessment

## 2023-06-30 ENCOUNTER — Ambulatory Visit: Admitting: Internal Medicine

## 2023-07-03 ENCOUNTER — Encounter: Payer: Self-pay | Admitting: Internal Medicine

## 2023-07-28 ENCOUNTER — Telehealth: Payer: Self-pay | Admitting: *Deleted

## 2023-07-28 NOTE — Telephone Encounter (Signed)
 CALLED PATIENT TO ABOUT SCHEDULING CT, LVM FOR A RETURN CALL

## 2023-08-02 ENCOUNTER — Ambulatory Visit: Payer: Medicare HMO | Admitting: Urology

## 2024-01-23 ENCOUNTER — Telehealth: Payer: Self-pay | Admitting: *Deleted

## 2024-01-23 NOTE — Telephone Encounter (Signed)
Called patient to ask about rescheduling missed scan, lvm for a return call °

## 2024-03-08 ENCOUNTER — Other Ambulatory Visit (HOSPITAL_COMMUNITY): Payer: Self-pay | Admitting: Internal Medicine

## 2024-03-08 DIAGNOSIS — G3184 Mild cognitive impairment, so stated: Secondary | ICD-10-CM

## 2024-03-08 DIAGNOSIS — M810 Age-related osteoporosis without current pathological fracture: Secondary | ICD-10-CM
